# Patient Record
Sex: Female | Born: 1965 | Race: White | Hispanic: No | Marital: Married | Smoking: Never smoker
Health system: Southern US, Community
[De-identification: ages and names within clinical notes are randomized; demographics above are authoritative.]

## PROBLEM LIST (undated history)

## (undated) DIAGNOSIS — E079 Disorder of thyroid, unspecified: Secondary | ICD-10-CM

## (undated) HISTORY — PX: NASAL SINUS SURGERY: SHX719

---

## 2019-02-14 DIAGNOSIS — E039 Hypothyroidism, unspecified: Secondary | ICD-10-CM | POA: Insufficient documentation

## 2021-05-08 ENCOUNTER — Other Ambulatory Visit: Payer: Self-pay | Admitting: Obstetrics & Gynecology

## 2021-05-08 DIAGNOSIS — R928 Other abnormal and inconclusive findings on diagnostic imaging of breast: Secondary | ICD-10-CM

## 2021-05-23 ENCOUNTER — Ambulatory Visit
Admission: RE | Admit: 2021-05-23 | Discharge: 2021-05-23 | Disposition: A | Discharge status: Home / Self Care | Payer: BC Managed Care – PPO | Source: Ambulatory Visit | Attending: Obstetrics & Gynecology | Admitting: Obstetrics & Gynecology

## 2021-05-23 ENCOUNTER — Ambulatory Visit
Admission: RE | Admit: 2021-05-23 | Discharge: 2021-05-23 | Disposition: A | Discharge status: Home / Self Care | Payer: Self-pay | Source: Ambulatory Visit | Attending: Obstetrics & Gynecology | Admitting: Obstetrics & Gynecology

## 2021-05-23 DIAGNOSIS — R928 Other abnormal and inconclusive findings on diagnostic imaging of breast: Secondary | ICD-10-CM

## 2021-09-04 ENCOUNTER — Emergency Department (HOSPITAL_BASED_OUTPATIENT_CLINIC_OR_DEPARTMENT_OTHER): Payer: BC Managed Care – PPO

## 2021-09-04 ENCOUNTER — Other Ambulatory Visit: Payer: Self-pay

## 2021-09-04 ENCOUNTER — Encounter (HOSPITAL_BASED_OUTPATIENT_CLINIC_OR_DEPARTMENT_OTHER): Payer: Self-pay | Admitting: *Deleted

## 2021-09-04 ENCOUNTER — Emergency Department (HOSPITAL_BASED_OUTPATIENT_CLINIC_OR_DEPARTMENT_OTHER): Payer: BC Managed Care – PPO | Admitting: Radiology

## 2021-09-04 ENCOUNTER — Emergency Department (HOSPITAL_BASED_OUTPATIENT_CLINIC_OR_DEPARTMENT_OTHER)
Admission: EM | Admit: 2021-09-04 | Discharge: 2021-09-04 | Disposition: A | Discharge status: Home / Self Care | Payer: BC Managed Care – PPO | Source: Home / Self Care | Attending: Emergency Medicine | Admitting: Emergency Medicine

## 2021-09-04 DIAGNOSIS — D696 Thrombocytopenia, unspecified: Secondary | ICD-10-CM | POA: Insufficient documentation

## 2021-09-04 DIAGNOSIS — R059 Cough, unspecified: Secondary | ICD-10-CM | POA: Insufficient documentation

## 2021-09-04 DIAGNOSIS — E871 Hypo-osmolality and hyponatremia: Secondary | ICD-10-CM | POA: Insufficient documentation

## 2021-09-04 DIAGNOSIS — R509 Fever, unspecified: Secondary | ICD-10-CM

## 2021-09-04 DIAGNOSIS — J3489 Other specified disorders of nose and nasal sinuses: Secondary | ICD-10-CM | POA: Insufficient documentation

## 2021-09-04 DIAGNOSIS — R55 Syncope and collapse: Secondary | ICD-10-CM | POA: Diagnosis not present

## 2021-09-04 DIAGNOSIS — N390 Urinary tract infection, site not specified: Secondary | ICD-10-CM | POA: Insufficient documentation

## 2021-09-04 DIAGNOSIS — Z20822 Contact with and (suspected) exposure to covid-19: Secondary | ICD-10-CM | POA: Insufficient documentation

## 2021-09-04 DIAGNOSIS — R300 Dysuria: Secondary | ICD-10-CM

## 2021-09-04 DIAGNOSIS — I602 Nontraumatic subarachnoid hemorrhage from anterior communicating artery: Secondary | ICD-10-CM | POA: Diagnosis not present

## 2021-09-04 HISTORY — DX: Disorder of thyroid, unspecified: E07.9

## 2021-09-04 LAB — URINALYSIS, ROUTINE W REFLEX MICROSCOPIC
Bilirubin Urine: NEGATIVE
Glucose, UA: NEGATIVE mg/dL
Hgb urine dipstick: NEGATIVE
Ketones, ur: NEGATIVE mg/dL
Leukocytes,Ua: NEGATIVE
Nitrite: NEGATIVE
Protein, ur: 30 mg/dL — AB
Specific Gravity, Urine: 1.028 (ref 1.005–1.030)
pH: 5.5 (ref 5.0–8.0)

## 2021-09-04 LAB — CBC WITH DIFFERENTIAL/PLATELET
Abs Immature Granulocytes: 0 10*3/uL (ref 0.00–0.07)
Basophils Absolute: 0 10*3/uL (ref 0.0–0.1)
Basophils Relative: 0 %
Eosinophils Absolute: 0 10*3/uL (ref 0.0–0.5)
Eosinophils Relative: 1 %
HCT: 41.7 % (ref 36.0–46.0)
Hemoglobin: 13.4 g/dL (ref 12.0–15.0)
Lymphocytes Relative: 13 %
Lymphs Abs: 0.4 10*3/uL — ABNORMAL LOW (ref 0.7–4.0)
MCH: 28.6 pg (ref 26.0–34.0)
MCHC: 32.1 g/dL (ref 30.0–36.0)
MCV: 89.1 fL (ref 80.0–100.0)
Monocytes Absolute: 0.3 10*3/uL (ref 0.1–1.0)
Monocytes Relative: 12 %
Neutro Abs: 2.1 10*3/uL (ref 1.7–7.7)
Neutrophils Relative %: 74 %
Platelets: 132 10*3/uL — ABNORMAL LOW (ref 150–400)
RBC: 4.68 MIL/uL (ref 3.87–5.11)
RDW: 13.4 % (ref 11.5–15.5)
Smear Review: DECREASED
WBC: 2.8 10*3/uL — ABNORMAL LOW (ref 4.0–10.5)
nRBC: 0 % (ref 0.0–0.2)

## 2021-09-04 LAB — BASIC METABOLIC PANEL
Anion gap: 6 (ref 5–15)
BUN: 16 mg/dL (ref 6–20)
CO2: 30 mmol/L (ref 22–32)
Calcium: 8.9 mg/dL (ref 8.9–10.3)
Chloride: 97 mmol/L — ABNORMAL LOW (ref 98–111)
Creatinine, Ser: 0.86 mg/dL (ref 0.44–1.00)
GFR, Estimated: >60 mL/min (ref >60)
Glucose, Bld: 100 mg/dL — ABNORMAL HIGH (ref 70–99)
Potassium: 3.7 mmol/L (ref 3.5–5.1)
Sodium: 133 mmol/L — ABNORMAL LOW (ref 135–145)

## 2021-09-04 LAB — RESP PANEL BY RT-PCR (FLU A&B, COVID) ARPGX2
Influenza A by PCR: NEGATIVE
Influenza B by PCR: NEGATIVE
SARS Coronavirus 2 by RT PCR: NEGATIVE

## 2021-09-04 LAB — LACTIC ACID, PLASMA: Lactic Acid, Venous: 0.8 mmol/L (ref 0.5–1.9)

## 2021-09-04 MED ORDER — CEFTRIAXONE SODIUM 1 G IJ SOLR
1.0000 g | Freq: Once | INTRAMUSCULAR | Status: AC
  Administered 2021-09-04: 1 g via INTRAMUSCULAR
  Filled 2021-09-04: qty 10

## 2021-09-04 MED ORDER — CEPHALEXIN 500 MG PO CAPS
500.0000 mg | ORAL_CAPSULE | Freq: Four times a day (QID) | ORAL | 0 refills | Status: DC
Start: 2021-09-04 — End: 2021-09-04

## 2021-09-04 MED ORDER — IBUPROFEN 800 MG PO TABS
800.0000 mg | ORAL_TABLET | Freq: Once | ORAL | Status: AC
Start: 2021-09-04 — End: 2021-09-04
  Administered 2021-09-04: 800 mg via ORAL
  Filled 2021-09-04: qty 1

## 2021-09-04 MED ORDER — CEPHALEXIN 500 MG PO CAPS: 500.0000 mg | ORAL_CAPSULE | Freq: Four times a day (QID) | ORAL | 0 refills | Status: DC

## 2021-09-04 NOTE — ED Triage Notes (Signed)
Pt was recently treated for a UTI, culture showed that it was an e-coli positive UTI. She completed her antibiotic on Monday.  Pt reports continued feeling unwell with pelvic pressure and feels not herself and has a cough.

## 2021-09-04 NOTE — ED Notes (Signed)
Patient tolerating admin of abx, will monitor for 15 mins for any reactions prior to d/c.

## 2021-09-04 NOTE — ED Provider Notes (Signed)
[facility] EMERGENCY DEPT Provider Note   CSN: [id number] Arrival date & time: 09/04/21  1450     History {Add pertinent medical, surgical, social history, OB history to HPI:1} Chief Complaint  Patient presents with   Urinary Tract Infection    Katherine Winters is a 56 y.o. female.  She has no significant past medical history.  Last week she had some dysuria and contacted her PCP who checked her urine, told her she tested positive for E. coli and put her on antibiotics.  That seemed to subside and she went to a horse show that was outside for multiple days.  Since then she has felt a little more fatigued and and had a nonproductive cough.  Still has a little bit of dysuria.  Has noticed a little bit of pain in her left flank.  She presented here today with a temperature of 102.  No worsening of cough no nausea vomiting diarrhea.  No gross hematuria.  The history is provided by the patient.  Fever Max temp prior to arrival:  102 Temp source:  Oral Progression:  Unchanged Chronicity:  New Relieved by:  None tried Worsened by:  Nothing Ineffective treatments:  None tried Associated symptoms: cough, dysuria and rhinorrhea   Associated symptoms: no chest pain, no chills, no diarrhea, no myalgias, no nausea, no rash, no sore throat and no vomiting   Risk factors: recent travel   Risk factors: no immunosuppression and no sick contacts       Home Medications Prior to Admission medications   Not on File      Allergies    Patient has no known allergies.    Review of Systems   Review of Systems  Constitutional:  Positive for fever. Negative for chills.  HENT:  Positive for rhinorrhea. Negative for sore throat.   Eyes:  Negative for visual disturbance.  Respiratory:  Positive for cough.   Cardiovascular:  Negative for chest pain.  Gastrointestinal:  Negative for diarrhea, nausea and vomiting.  Genitourinary:  Positive for dysuria.  Musculoskeletal:  Negative for  myalgias.  Skin:  Negative for rash.   Physical Exam Updated Vital Signs BP 136/84 (BP Location: Right Arm)   Pulse 80   Temp 98.4 F (36.9 C) (Oral)   Resp 15   Wt 89.4 kg   SpO2 99%  Physical Exam Vitals and nursing note reviewed.  Constitutional:      General: She is not in acute distress.    Appearance: Normal appearance. She is well-developed.  HENT:     Head: Normocephalic and atraumatic.     Mouth/Throat:     Mouth: Mucous membranes are moist.     Pharynx: Oropharynx is clear.  Eyes:     Conjunctiva/sclera: Conjunctivae normal.  Cardiovascular:     Rate and Rhythm: Normal rate and regular rhythm.     Heart sounds: No murmur heard. Pulmonary:     Effort: Pulmonary effort is normal. No respiratory distress.     Breath sounds: Normal breath sounds.  Abdominal:     Palpations: Abdomen is soft.     Tenderness: There is no abdominal tenderness. There is no guarding or rebound.  Musculoskeletal:     Cervical back: Neck supple.     Right lower leg: No edema.     Left lower leg: No edema.  Skin:    General: Skin is warm and dry.     Capillary Refill: Capillary refill takes less than 2 seconds.  Neurological:  General: No focal deficit present.     Mental Status: She is alert.    ED Results / Procedures / Treatments   Labs (all labs ordered are listed, but only abnormal results are displayed) Labs Reviewed  URINALYSIS, ROUTINE W REFLEX MICROSCOPIC - Abnormal; Notable for the following components:      Result Value   APPearance HAZY (*)    Protein, ur 30 (*)    Crystals PRESENT (*)    All other components within normal limits  CBC WITH DIFFERENTIAL/PLATELET - Abnormal; Notable for the following components:   WBC 2.8 (*)    Platelets 132 (*)    Lymphs Abs 0.4 (*)    All other components within normal limits  BASIC METABOLIC PANEL - Abnormal; Notable for the following components:   Sodium 133 (*)    Chloride 97 (*)    Glucose, Bld 100 (*)    All other  components within normal limits    EKG None  Radiology DG Chest 2 View  Result Date: 09/04/2021 CLINICAL DATA:  Cough, fever. EXAM: CHEST - 2 VIEW COMPARISON:  None Available. FINDINGS: The heart size and mediastinal contours are within normal limits. Both lungs are clear. The visualized skeletal structures are unremarkable. IMPRESSION: No active cardiopulmonary disease. Electronically Signed   By: Lupita Raider M.D.   On: 09/04/2021 15:55    Procedures Procedures  {Document cardiac monitor, telemetry assessment procedure when appropriate:1}  Medications Ordered in ED Medications  ibuprofen (ADVIL) tablet 800 mg (800 mg Oral Given 09/04/21 1542)    ED Course/ Medical Decision Making/ A&P                           Medical Decision Making Amount and/or Complexity of Data Reviewed Labs: ordered. Radiology: ordered.  Risk Prescription drug management.  Katherine Winters was evaluated in Emergency Department on 09/04/2021 for the symptoms described in the history of present illness. She was evaluated in the context of the global COVID-19 pandemic, which necessitated consideration that the patient might be at risk for infection with the SARS-CoV-2 virus that causes COVID-19. Institutional protocols and algorithms that pertain to the evaluation of patients at risk for COVID-19 are in a state of rapid change based on information released by regulatory bodies including the CDC and federal and state organizations. These policies and algorithms were followed during the patient's care in the ED.  This patient complains of ***; this involves an extensive number of treatment Options and is a complaint that carries with it a high risk of complications and morbidity. The differential includes ***  I ordered, reviewed and interpreted labs, which included *** I ordered medication *** and reviewed PMP when indicated. I ordered imaging studies which included *** and I independently    visualized and  interpreted imaging which showed *** Additional history obtained from *** Previous records obtained and reviewed *** I consulted *** and discussed lab and imaging findings and discussed disposition.  Cardiac monitoring reviewed, *** Social determinants considered, *** Critical Interventions: ***  After the interventions stated above, I reevaluated the patient and found *** Admission and further testing considered, ***    {Document critical care time when appropriate:1} {Document review of labs and clinical decision tools ie heart score, Chads2Vasc2 etc:1}  {Document your independent review of radiology images, and any outside records:1} {Document your discussion with family members, caretakers, and with consultants:1} {Document social determinants of health affecting pt's care:1} {Document your decision  making why or why not admission, treatments were needed:1} Final Clinical Impression(s) / ED Diagnoses Final diagnoses:  None    Rx / DC Orders ED Discharge Orders     None

## 2021-09-04 NOTE — ED Notes (Signed)
Patient denies any s/s of an allergic reaction from the antibiotic administration.

## 2021-09-04 NOTE — Discharge Instructions (Signed)
You were seen in the emergency department for fever malaise in the setting of a recent urinary tract infection.  We did not find an obvious cause of your fever.  You were given a dose of antibiotics and a prescription to cover possible recurrent urinary tract infection.  Please drink plenty of fluids and monitor your symptoms.  Follow-up with your doctor.  Return to the emergency department if any worsening or concerning symptoms.

## 2021-09-06 LAB — URINE CULTURE

## 2021-09-07 ENCOUNTER — Emergency Department (HOSPITAL_BASED_OUTPATIENT_CLINIC_OR_DEPARTMENT_OTHER): Payer: BC Managed Care – PPO

## 2021-09-07 ENCOUNTER — Emergency Department (HOSPITAL_BASED_OUTPATIENT_CLINIC_OR_DEPARTMENT_OTHER): Payer: BC Managed Care – PPO | Admitting: Radiology

## 2021-09-07 ENCOUNTER — Other Ambulatory Visit: Payer: Self-pay

## 2021-09-07 ENCOUNTER — Encounter (HOSPITAL_BASED_OUTPATIENT_CLINIC_OR_DEPARTMENT_OTHER): Payer: Self-pay | Admitting: Emergency Medicine

## 2021-09-07 ENCOUNTER — Inpatient Hospital Stay (HOSPITAL_BASED_OUTPATIENT_CLINIC_OR_DEPARTMENT_OTHER)
Admission: EM | Admit: 2021-09-07 | Discharge: 2021-09-09 | DRG: 064 | Disposition: A | Discharge status: Home / Self Care | Payer: BC Managed Care – PPO | Attending: Family Medicine | Admitting: Family Medicine

## 2021-09-07 DIAGNOSIS — E860 Dehydration: Secondary | ICD-10-CM | POA: Diagnosis present

## 2021-09-07 DIAGNOSIS — R55 Syncope and collapse: Secondary | ICD-10-CM | POA: Diagnosis present

## 2021-09-07 DIAGNOSIS — B349 Viral infection, unspecified: Secondary | ICD-10-CM | POA: Diagnosis not present

## 2021-09-07 DIAGNOSIS — I609 Nontraumatic subarachnoid hemorrhage, unspecified: Secondary | ICD-10-CM

## 2021-09-07 DIAGNOSIS — Z79899 Other long term (current) drug therapy: Secondary | ICD-10-CM

## 2021-09-07 DIAGNOSIS — I959 Hypotension, unspecified: Secondary | ICD-10-CM | POA: Diagnosis present

## 2021-09-07 DIAGNOSIS — Z20822 Contact with and (suspected) exposure to covid-19: Secondary | ICD-10-CM | POA: Diagnosis not present

## 2021-09-07 DIAGNOSIS — I602 Nontraumatic subarachnoid hemorrhage from anterior communicating artery: Principal | ICD-10-CM | POA: Diagnosis present

## 2021-09-07 DIAGNOSIS — J129 Viral pneumonia, unspecified: Secondary | ICD-10-CM | POA: Diagnosis present

## 2021-09-07 DIAGNOSIS — Z79890 Hormone replacement therapy (postmenopausal): Secondary | ICD-10-CM | POA: Diagnosis not present

## 2021-09-07 DIAGNOSIS — E039 Hypothyroidism, unspecified: Secondary | ICD-10-CM | POA: Diagnosis present

## 2021-09-07 LAB — CBC
HCT: 38.3 % (ref 36.0–46.0)
Hemoglobin: 12.6 g/dL (ref 12.0–15.0)
MCH: 28.9 pg (ref 26.0–34.0)
MCHC: 32.9 g/dL (ref 30.0–36.0)
MCV: 87.8 fL (ref 80.0–100.0)
Platelets: 112 10*3/uL — ABNORMAL LOW (ref 150–400)
RBC: 4.36 MIL/uL (ref 3.87–5.11)
RDW: 13.9 % (ref 11.5–15.5)
WBC: 4.4 10*3/uL (ref 4.0–10.5)
nRBC: 0 % (ref 0.0–0.2)

## 2021-09-07 LAB — SARS CORONAVIRUS 2 BY RT PCR: SARS Coronavirus 2 by RT PCR: NEGATIVE

## 2021-09-07 LAB — COMPREHENSIVE METABOLIC PANEL
ALT: 43 U/L (ref 0–44)
AST: 58 U/L — ABNORMAL HIGH (ref 15–41)
Albumin: 3.6 g/dL (ref 3.5–5.0)
Alkaline Phosphatase: 52 U/L (ref 38–126)
Anion gap: 10 (ref 5–15)
BUN: 14 mg/dL (ref 6–20)
CO2: 29 mmol/L (ref 22–32)
Calcium: 8.7 mg/dL — ABNORMAL LOW (ref 8.9–10.3)
Chloride: 93 mmol/L — ABNORMAL LOW (ref 98–111)
Creatinine, Ser: 0.87 mg/dL (ref 0.44–1.00)
GFR, Estimated: >60 mL/min (ref >60)
Glucose, Bld: 131 mg/dL — ABNORMAL HIGH (ref 70–99)
Potassium: 3.3 mmol/L — ABNORMAL LOW (ref 3.5–5.1)
Sodium: 132 mmol/L — ABNORMAL LOW (ref 135–145)
Total Bilirubin: 0.7 mg/dL (ref 0.3–1.2)
Total Protein: 6.5 g/dL (ref 6.5–8.1)

## 2021-09-07 LAB — D-DIMER, QUANTITATIVE: D-Dimer, Quant: 3.08 ug/mL-FEU — ABNORMAL HIGH (ref 0.00–0.50)

## 2021-09-07 LAB — PREGNANCY, URINE: Preg Test, Ur: NEGATIVE

## 2021-09-07 MED ORDER — ACETAMINOPHEN-CODEINE 300-30 MG PO TABS
1.0000 | ORAL_TABLET | Freq: Four times a day (QID) | ORAL | Status: DC | PRN
  Administered 2021-09-07 – 2021-09-08 (×2): 2 via ORAL
  Filled 2021-09-07 (×3): qty 2

## 2021-09-07 MED ORDER — ACETAMINOPHEN 325 MG PO TABS: 650.0000 mg | ORAL_TABLET | Freq: Four times a day (QID) | ORAL | Status: DC | PRN

## 2021-09-07 MED ORDER — IOHEXOL 350 MG/ML SOLN
75.0000 mL | Freq: Once | INTRAVENOUS | Status: AC | PRN
  Administered 2021-09-07: 75 mL via INTRAVENOUS

## 2021-09-07 MED ORDER — SODIUM CHLORIDE 0.9 % IV BOLUS
1000.0000 mL | Freq: Once | INTRAVENOUS | Status: AC
  Administered 2021-09-07: 1000 mL via INTRAVENOUS

## 2021-09-07 MED ORDER — LACTATED RINGERS IV SOLN: INTRAVENOUS | Status: DC

## 2021-09-07 MED ORDER — LEVOTHYROXINE SODIUM 75 MCG PO TABS
75.0000 ug | ORAL_TABLET | Freq: Every day | ORAL | Status: DC
  Administered 2021-09-08 – 2021-09-09 (×2): 75 ug via ORAL
  Filled 2021-09-07 (×2): qty 1

## 2021-09-07 MED ORDER — POTASSIUM CHLORIDE 20 MEQ PO PACK
40.0000 meq | PACK | Freq: Once | ORAL | Status: AC
  Administered 2021-09-07: 40 meq via ORAL
  Filled 2021-09-07: qty 2

## 2021-09-07 MED ORDER — ACETAMINOPHEN 325 MG PO TABS
650.0000 mg | ORAL_TABLET | Freq: Once | ORAL | Status: AC
  Administered 2021-09-07: 650 mg via ORAL
  Filled 2021-09-07: qty 2

## 2021-09-07 MED ORDER — ACETAMINOPHEN 650 MG RE SUPP: 650.0000 mg | Freq: Four times a day (QID) | RECTAL | Status: DC | PRN

## 2021-09-07 NOTE — Progress Notes (Signed)
Discussed case with Dr. Danielle Dess with neurosurgery.  He believes trauma would be an usual cause of her SAH with her pattern of bleeding. Syncope could possibly be due to [facility].  He advised neuro checks q2h at least overnight as well as mIVF with LR. No indication to get repeat CT scan at this time unless her neuro status changes.  He will come see the patient later tonight.

## 2021-09-07 NOTE — Progress Notes (Signed)
FPTS Brief Progress Note  S:Went bedside to see patient. Patient laying comfortably in bed, complaining of some cough.    O: BP (!) 99/47   Pulse 84   Temp 100.3 F (37.9 C) (Oral)   Resp (!) 22   Ht 5\' 6"  (1.676 m)   Wt 81.6 kg   SpO2 96%   BMI 29.05 kg/m   Neuro: Sensation grossly intact, motor 5/5 in all extremities, EOMI, visual fields normal, cranial nerves grossly intact Resp: CTABL Card: RRR, NRMG  A/P: Syncope  SAH Patient appears stable with no FND at this time. No complaints of dizziness. - Plans per day team - Orders reviewed. Labs for AM ordered, which was adjusted as needed.   , MD 09/07/2021, 8:35 PM PGY-1, [facility] Night Resident  Please page [phone] with questions.

## 2021-09-07 NOTE — H&P (Signed)
Family Medicine Teaching [facility] Admission History and Physical Service Pager: [phone]  Patient name: Katherine Winters Medical record number: [id number] Date of birth: March 04, 1966 Age: 56 y.o. Gender: female  Primary Care Provider: Ladora Daniel, PA-C Consultants:  Code Status: Full   Preferred Emergency Contact:  Contact Information     Name Relation Home Work Mobile   Merced,Ken Spouse   [phone]        Chief Complaint: Syncope  Assessment and Plan: Katherine Winters is a 56 y.o. female without significant medical present with syncope.    Syncope likely 2/2 to [facility] Patient admitted for syncope episode with loss of consciousness. Report intermittent fever through the week with generalized weakness and dry cough. She also endorses lack of appetite with poor PO intake.  Initial labs showed no leukocytosis, negative pregnancy and COVID test.  Patient's overall presentation is suspicious of possible viral illness with poor p.o. intake.  Given high report of intermittent hypotension we will orthostatic vitals.  Also her syncope could be attributed to subarachnoid hemorrhage which is seen on CT. Will consult neuro surgery for recommendations. Other causes of syncope to be considered include vasal vagal from constant coughing or structural heart defect. Will obtain echo. Although on exam patient had normal neuro exam, however given loss of consciousness and head CT finding low threshold to obtain EEG.   At this time we will continue conservative management with neurochecks, obtain blood cultures and follow vitals closely.   - Admit to Med Tele with FPTS, attending Dr. Jennette Kettle -Neurology consulted in the ER, appreciate recs - Continuous cardiac monitoring -PT/OT eval and treat -Neurochecks Q2H -Fall precaution -Follow- up Echo -Continue routine vitals -SCDs for VTE prophylaxis -Blood pressure systolic goes <160  Hypothyroidism Chronic and stable.  On home medication of Synthroid 1 seven  5 mcg daily. -Continue home Synthroid   FEN/GI: Regular diet Prophylaxis: SCDs  Disposition: Progressive  History of Present Illness:  Katherine Winters is a 56 y.o. female presenting with [place] and generalized weakness.  #Her symptoms started about a week ago with generalized weakness.  Prior to that patient says she was diagnosed with UTI and was started on antibiotics which she could not remember the name.  Symptoms started after she went on a horseshoe where she was rained on.  It was after this showed the patient started feeling generally weak and but is having some intermittent fevers noted responded well to Advil.  The last week patient states she has been having poor appetite which significant decrease in p.o. intake.  In addition to the generalized weakness patient endorsed having cough and mild headache but denies any chest pain, palpitations, dizziness or lightheadedness.  This morning she went to sit on the porch when she noticed her vision started fading out, at that time patient decided to go to the fridge to get some Coke because her sugar might be low. Next thing she remembered was her on the floor, she got scared and called her husband to take her to the ED  Patient denies any history of smoking, rare use of alcohol and no illicit drugs.  The ED head CT showed subarachnoid hemorrhage and neurosurgery was consulted who recommend admitting patient for syncope rule out.  Neurology recommend no surgical intervention necessary at this time.   Review Of Systems: Per HPI with the following additions:   Review of Systems  Constitutional:  Positive for appetite change, fatigue and fever. Negative for chills.  HENT:  Positive for congestion and postnasal  drip. Negative for rhinorrhea and sore throat.   Respiratory:  Positive for cough. Negative for chest tightness and wheezing.   Cardiovascular:  Negative for chest pain, palpitations and leg swelling.  Gastrointestinal:  Negative for  abdominal pain, constipation, diarrhea, nausea and vomiting.  Genitourinary:  Negative for dysuria, hematuria, urgency and vaginal discharge.  Neurological:  Positive for headaches. Negative for dizziness, seizures, syncope and light-headedness.    There are no problems to display for this patient.   Past Medical History: Past Medical History:  Diagnosis Date   Thyroid disease     Past Surgical History: Past Surgical History:  Procedure Laterality Date   NASAL SINUS SURGERY      Social History: Social History   Tobacco Use   Smoking status: Never   Smokeless tobacco: Never  Vaping Use   Vaping Use: Never used  Substance Use Topics   Alcohol use: Never   Additional social history:   Please also refer to relevant sections of EMR.  Family History: Family History  Problem Relation Age of Onset   Breast cancer Maternal Grandmother    (If not completed, MUST add something in)  Allergies and Medications: No Known Allergies No current facility-administered medications on file prior to encounter.   Current Outpatient Medications on File Prior to Encounter  Medication Sig Dispense Refill   Ascorbic Acid (VITAMIN C PO) Take 1 tablet by mouth daily.     cephALEXin (KEFLEX) 500 MG capsule Take 1 capsule (500 mg total) by mouth 4 (four) times daily. 28 capsule 0   Dextromethorphan-guaiFENesin (DELSYM COUGH/CHEST CONGEST DM) 5-100 MG/5ML LIQD Take 15 mLs by mouth at bedtime as needed (cough, fever).     ibuprofen (ADVIL) 200 MG tablet Take 200 mg by mouth every 6 (six) hours as needed for fever.     levothyroxine (SYNTHROID) 75 MCG tablet Take 75 mcg by mouth daily.      Objective: BP 121/67 (BP Location: Right Arm)   Pulse 96   Temp (!) 102.5 F (39.2 C) (Oral)   Resp 20   Ht 5\' 6"  (1.676 m)   Wt 81.6 kg   SpO2 100%   BMI 29.05 kg/m  Exam: General: Alert, well appearing, NAD HEENT: Atraumatic, MMM, No sclera icterus CV: RRR, no murmurs, normal S1/S2 Pulm: CTAB,  good WOB on RA, no crackles or wheezing Abd: Soft, no distension, no tenderness Skin: dry, warm Ext: No BLE edema, +2 Pedal and radial pulse.   Labs and Imaging: CBC BMET  Recent Labs  Lab 09/07/21 0915  WBC 4.4  HGB 12.6  HCT 38.3  PLT 112*   Recent Labs  Lab 09/07/21 0915  NA 132*  K 3.3*  CL 93*  CO2 29  BUN 14  CREATININE 0.87  GLUCOSE 131*  CALCIUM 8.7*     EKG: Normal sinus rhythm with non specific ST changes  11/07/21, MD 09/07/2021, 5:11 PM PGY-1, [facility] FPTS Intern pager: [phone], text pages welcome

## 2021-09-07 NOTE — Hospital Course (Signed)
Katherine Winters is a 56 y.o. female without significant PMHx who presented after syncopal episode and found to have subarachnoid hemorrhage and likely pneumonia.   Subarachnoid hemorrhage Patient was admitted after syncopal episode where she hit her head.  Head CT showed acute subarachnoid hemorrhage at the anterior interhemispheric fissure and layering atop the anterior corpus callosum.  Neurosurgery was consulted and recommended no surgical intervention at this time.  They did not think based on location of hemorrhage that it was caused by the fall but could have been caused by an aneurysm.  CTA head and neck was done which did not show an aneurysm.  They recommended outpatient follow-up with neurosurgery in 2 to 3 weeks after discharge.  Syncope Patient was admitted for syncope work-up.  She had low blood pressures near the start of admission with positive orthostatic vitals. BP was as low as 70s/40s. This significantly improved after 1 L bolus of fluids and maintenance IV fluids.  Repeat orthostatic vitals were normal and patient was normotensive prior to discharge.  Echocardiogram was completed which showed an ejection fraction of 60-65%, normal left ventricular function with no regional wall abnormalities and normal diastolic parameters with no valvular pathology.  It did show trivial pericardial effusion.  Syncope was likely due to combination of low blood pressure in the setting of dehydration and pneumonia.  Patient was advised at discharge to push oral fluids to stay hydrated and was given return precautions  Pneumonia Patient reported week long symptoms of cough, congestion, fatigue, and fevers.  Labs showed no leukocytosis and CXR did not suggest PNA. CT chest showed small bilateral pleural effusions with bilateral upper and lower lobe reticulonodular opacities likely representing mild infection.  Patient was started on azithromycin 500 mg daily.  She was discharged with 1 more dose of azithromycin  to complete a 3-day course. Of note, CTA also showed Multiple nodular opacities in the visualized right upper lobe measuring up to 7 mm, radiologist recommended non-contrast chest CT at 3-6 months to ensure resolution.   Issues for follow up No horseback riding until follow-up with neurosurgeon Ensure follow-up with neurosurgeon Ensure adequate p.o. intake to stay hydrated If patient does not get well, send to pulmonary for evaluation of possible pneumonitis.

## 2021-09-07 NOTE — ED Provider Notes (Signed)
Patient seen on arrival after transfer to our facility due to concern for intracranial hemorrhage in the context of syncope.  Patient is awake, alert, neurologically unremarkable.  I discussed her transfer with Dr. Danielle Dess our neurosurgeon.  He has reviewed imaging as well, is available for consult, requests medical team for admission.   Gerhard Munch, MD 09/07/21 [phone]

## 2021-09-07 NOTE — ED Notes (Signed)
Attempted to call x2 to give report

## 2021-09-07 NOTE — ED Notes (Signed)
Pt here from [facility] with complaints of syncope and fall today. Pt also complains of cough, low grade fever, and weakness x1 week.

## 2021-09-07 NOTE — Consult Note (Signed)
Reason for Consult: Subarachnoid hemorrhage Referring Physician: Dr. Marilynne Drivers is an 56 y.o. female.  HPI: Patient is a 56 year old right-handed individual who has been feeling poorly over the past few weeks having had some urinary tract infection respiratory infection also she had a fall today as she felt dizzy and lightheaded and had struck her head CT scan was performed at [facility] and this demonstrated presence of subarachnoid blood in the interhemispheric fissure just above the corpus callosum.'s concern for aneurysmal bleed from perhaps an anterior communicating artery aneurysm and a CTA was performed.  CTA demonstrates that there is no evidence of an aneurysm.  Nonetheless the patient has been having some headache and is now admitted for observation.  Past Medical History:  Diagnosis Date   Thyroid disease     Past Surgical History:  Procedure Laterality Date   NASAL SINUS SURGERY      Family History  Problem Relation Age of Onset   Breast cancer Maternal Grandmother     Social History:  reports that she has never smoked. She has never used smokeless tobacco. She reports that she does not drink alcohol. No history on file for drug use.  Allergies: No Known Allergies  Medications: I have not reviewed the patient's previous medications  Results for orders placed or performed during the hospital encounter of 09/07/21 (from the past 48 hour(s))  Comprehensive metabolic panel     Status: Abnormal   Collection Time: 09/07/21  9:15 AM  Result Value Ref Range   Sodium 132 (L) 135 - 145 mmol/L   Potassium 3.3 (L) 3.5 - 5.1 mmol/L   Chloride 93 (L) 98 - 111 mmol/L   CO2 29 22 - 32 mmol/L   Glucose, Bld 131 (H) 70 - 99 mg/dL    Comment: Glucose reference range applies only to samples taken after fasting for at least 8 hours.   BUN 14 6 - 20 mg/dL   Creatinine, Ser 0.87 0.44 - 1.00 mg/dL   Calcium 8.7 (L) 8.9 - 10.3 mg/dL   Total Protein 6.5 6.5 - 8.1 g/dL    Albumin 3.6 3.5 - 5.0 g/dL   AST 58 (H) 15 - 41 U/L   ALT 43 0 - 44 U/L   Alkaline Phosphatase 52 38 - 126 U/L   Total Bilirubin 0.7 0.3 - 1.2 mg/dL   GFR, Estimated >60 >60 mL/min    Comment: (NOTE) Calculated using the CKD-EPI Creatinine Equation (2021)    Anion gap 10 5 - 15    Comment: Performed at [facility], [address] [place], [place] [address]  CBC     Status: Abnormal   Collection Time: 09/07/21  9:15 AM  Result Value Ref Range   WBC 4.4 4.0 - 10.5 K/uL   RBC 4.36 3.87 - 5.11 MIL/uL   Hemoglobin 12.6 12.0 - 15.0 g/dL   HCT 38.3 36.0 - 46.0 %   MCV 87.8 80.0 - 100.0 fL   MCH 28.9 26.0 - 34.0 pg   MCHC 32.9 30.0 - 36.0 g/dL   RDW 13.9 11.5 - 15.5 %   Platelets 112 (L) 150 - 400 K/uL    Comment: SPECIMEN CHECKED FOR CLOTS Immature Platelet Fraction may be clinically indicated, consider ordering this additional test [id number] CONSISTENT WITH PREVIOUS RESULT REPEATED TO VERIFY    nRBC 0.0 0.0 - 0.2 %    Comment: Performed at [facility], [address], [place], [place] [address]  D-dimer, quantitative  Status: Abnormal   Collection Time: 09/07/21  9:15 AM  Result Value Ref Range   D-Dimer, Quant 3.08 (H) 0.00 - 0.50 ug/mL-FEU    Comment: (NOTE) At the manufacturer cut-off value of 0.5 g/mL FEU, this assay has a negative predictive value of 95-100%.This assay is intended for use in conjunction with a clinical pretest probability (PTP) assessment model to exclude pulmonary embolism (PE) and deep venous thrombosis (DVT) in outpatients suspected of PE or DVT. Results should be correlated with clinical presentation. Performed at [facility], [address], [place], [place] [address]   SARS Coronavirus 2 by RT PCR (hospital order, performed in [facility] hospital lab) *cepheid single result test* Anterior Nasal Swab     Status: None   Collection Time: 09/07/21 11:40 AM   Specimen: Anterior Nasal  Swab  Result Value Ref Range   SARS Coronavirus 2 by RT PCR NEGATIVE NEGATIVE    Comment: (NOTE) SARS-CoV-2 target nucleic acids are NOT DETECTED.  The SARS-CoV-2 RNA is generally detectable in upper and lower respiratory specimens during the acute phase of infection. The lowest concentration of SARS-CoV-2 viral copies this assay can detect is 250 copies / mL. A negative result does not preclude SARS-CoV-2 infection and should not be used as the sole basis for treatment or other patient management decisions.  A negative result may occur with improper specimen collection / handling, submission of specimen other than nasopharyngeal swab, presence of viral mutation(s) within the areas targeted by this assay, and inadequate number of viral copies (<250 copies / mL). A negative result must be combined with clinical observations, patient history, and epidemiological information.  Fact Sheet for Patients:   https://www.patel.info/  Fact Sheet for Healthcare Providers: https://hall.com/  This test is not yet approved or  cleared by the [place] FDA and has been authorized for detection and/or diagnosis of SARS-CoV-2 by FDA under an Emergency Use Authorization (EUA).  This EUA will remain in effect (meaning this test can be used) for the duration of the COVID-19 declaration under Section 564(b)(1) of the Act, 21 U.S.C. section 360bbb-3(b)(1), unless the authorization is terminated or revoked sooner.  Performed at [facility], [address] [place], [place] [address]   Pregnancy, urine     Status: None   Collection Time: 09/07/21 12:00 PM  Result Value Ref Range   Preg Test, Ur NEGATIVE NEGATIVE    Comment:        THE SENSITIVITY OF THIS METHODOLOGY IS >20 mIU/mL. Performed at [facility], [address], [place], [place] [address]     CT ANGIO HEAD NECK W WO CM  Result Date:  09/07/2021 CLINICAL DATA:  Syncope/presyncope, cerebrovascular cause suspected SAH on CTH EXAM: CT ANGIOGRAPHY HEAD AND NECK TECHNIQUE: Multidetector CT imaging of the head and neck was performed using the standard protocol during bolus administration of intravenous contrast. Multiplanar CT image reconstructions and MIPs were obtained to evaluate the vascular anatomy. Carotid stenosis measurements (when applicable) are obtained utilizing NASCET criteria, using the distal internal carotid diameter as the denominator. RADIATION DOSE REDUCTION: This exam was performed according to the departmental dose-optimization program which includes automated exposure control, adjustment of the mA and/or kV according to patient size and/or use of iterative reconstruction technique. CONTRAST:  68mL OMNIPAQUE IOHEXOL 350 MG/ML SOLN COMPARISON:  CT head from the same day. FINDINGS: CTA NECK FINDINGS Aortic arch: Great vessel origins are patent without proximal hemodynamically significant stenosis. Right carotid system: No evidence of dissection, stenosis (  50% or greater) or occlusion. Left carotid system: No evidence of dissection, stenosis (50% or greater) or occlusion. Vertebral arteries: Codominant. No evidence of dissection, stenosis (50% or greater) or occlusion. Skeleton: Moderate degenerative disease at C5-C6. Other neck: Upper chest: Multiple nodular opacities in the visualized right upper lobe measuring up to 7 mm with mild surrounding tree-in-bud opacities Review of the MIP images confirms the above findings CTA HEAD FINDINGS Anterior circulation: Bilateral intracranial ICAs, MCAs, and ACAs are patent without proximal hemodynamically significant stenosis. No aneurysm identified. Posterior circulation: Bilateral intradural vertebral arteries, basilar artery and bilateral posterior cerebral arteries are patent without proximal hemodynamically significant stenosis. Right fetal type PCA, anatomic variant. No aneurysm identified.  Venous sinuses: As permitted by contrast timing, patent. Anatomic variants: Described above. Review of the MIP images confirms the above findings IMPRESSION: 1. No emergent large vessel occlusion, proximal hemodynamically significant stenosis, or evidence of aneurysm. 2. Multiple nodular opacities in the visualized right upper lobe measuring up to 7 mm with mild surrounding tree-in-bud opacities. Findings could be infectious/inflammatory in etiology; however, recommend non-contrast chest CT at 3-6 months to ensure resolution. If the nodules are stable at time of repeat CT, then future CT at 18-24 months (from today's scan) is considered optional for low-risk patients, but is recommended for high-risk patients. This recommendation follows the consensus statement: Guidelines for Management of Incidental Pulmonary Nodules Detected on CT Images: From the Fleischner Society 2017; Radiology 2017; 284:228-243. Electronically Signed   By: Feliberto Harts M.D.   On: 09/07/2021 13:21   DG Chest 2 View  Result Date: 09/07/2021 CLINICAL DATA:  Cough.  Loss of consciousness. EXAM: CHEST - 2 VIEW COMPARISON:  Chest two views 09/04/2021 FINDINGS: Cardiac silhouette and mediastinal contours are within normal limits with mild calcification again seen within the aortic arch. Minimal lateral lower left lung horizontal subsegmental atelectasis versus scarring likely within the lingula appears new from prior. On lateral view no focal airspace opacity is seen to indicate pneumonia. No pleural effusion or pneumothorax. Mild multilevel degenerative disc changes of the thoracic spine. IMPRESSION: Mild horizontal linear subsegmental atelectasis versus scarring within the lingula on frontal view. No focal airspace opacity to indicate pneumonia. Electronically Signed   By: Neita Garnet M.D.   On: 09/07/2021 12:05   CT Head Wo Contrast  Result Date: 09/07/2021 CLINICAL DATA:  Blunt trauma, passed out today, fell striking back of head,  unsteady standing, recently treated for UTI EXAM: CT HEAD WITHOUT CONTRAST TECHNIQUE: Contiguous axial images were obtained from the base of the skull through the vertex without intravenous contrast. RADIATION DOSE REDUCTION: This exam was performed according to the departmental dose-optimization program which includes automated exposure control, adjustment of the mA and/or kV according to patient size and/or use of iterative reconstruction technique. COMPARISON:  None Available. FINDINGS: Brain: Normal ventricular morphology. No midline shift or mass effect. Subarachnoid hemorrhage identified within the anterior interhemispheric fissure and layering atop the corpus callosum. No intraparenchymal hemorrhage, mass lesion, or evidence of acute infarction. No additional extra-axial fluid collections. Vascular: No hyperdense vessels Skull: Calvaria intact.  Posterior RIGHT parietal scalp hematoma. Sinuses/Orbits: Clear Other: N/A IMPRESSION: Acute subarachnoid hemorrhage at the anterior interhemispheric fissure and layering atop the anterior corpus callosum. No intraparenchymal abnormalities. Critical Value/emergent results were called by telephone at the time of interpretation on 09/07/2021 at 12:09 pm to provider KRISTIE HORTON DO, who verbally acknowledged these results. Electronically Signed   By: Ulyses Southward M.D.   On: 09/07/2021 12:10  Review of Systems  Constitutional:  Positive for activity change.  Respiratory:  Positive for cough and wheezing.   Neurological:  Positive for light-headedness and headaches.  All other systems reviewed and are negative. Blood pressure (!) 99/47, pulse 84, temperature 100.3 F (37.9 C), temperature source Oral, resp. rate (!) 22, height 5\' 6"  (1.676 m), weight 81.6 kg, SpO2 96 %. Physical Exam Constitutional:      Appearance: Normal appearance.  HENT:     Head: Normocephalic and atraumatic.     Right Ear: Tympanic membrane normal.     Left Ear: Tympanic membrane  normal.     Nose: Nose normal.     Mouth/Throat:     Mouth: Mucous membranes are moist.  Eyes:     Extraocular Movements: Extraocular movements intact.     Conjunctiva/sclera: Conjunctivae normal.     Pupils: Pupils are equal, round, and reactive to light.  Cardiovascular:     Rate and Rhythm: Normal rate and regular rhythm.  Pulmonary:     Effort: Pulmonary effort is normal.  Abdominal:     General: Abdomen is flat. Bowel sounds are normal.     Palpations: Abdomen is soft.  Musculoskeletal:        General: Normal range of motion.     Cervical back: Normal range of motion.  Skin:    General: Skin is warm and dry.     Capillary Refill: Capillary refill takes less than 2 seconds.  Neurological:     Mental Status: She is alert.     Comments: Alert and oriented.  No meningismus is noted.  Cranial nerve examination is completely within the limits of normal.  Motor function is intact in the upper and lower extremities with normal reflexes in the biceps triceps brachioradialis 2+ reflexes in the patella and Achilles downgoing Babinski's are noted.  Psychiatric:        Mood and Affect: Mood normal.        Thought Content: Thought content normal.        Judgment: Judgment normal.    Assessment/Plan: Subarachnoid hemorrhage near the midline in the interhemispheric fissure is suggestive of possible anterior communicating artery aneurysm however none is found on the CTA.  Patient's headache is modest and she does not have any meningismus at this time.  Plan observation with IV hydration and treatment of general medical condition is suggested at this time.  We will follow along.  Blanchie Dessert Karessa Onorato 09/07/2021, 8:22 PM

## 2021-09-07 NOTE — ED Provider Notes (Signed)
[facility] EMERGENCY DEPT Provider Note   CSN: [id number] Arrival date & time: 09/07/21  [id number]     History  Chief Complaint  Patient presents with   Loss of Consciousness    Katherine Winters is a 56 y.o. female.  HPI  56 year old female presents to the emergency department with generalized illness for the past week as well as a syncopal episode with head injury.  Patient states a little over a week ago she was diagnosed with a UTI, completed an outpatient regimen of antibiotics.  She was seen here about 3 days ago with ongoing generalized illness and fatigue.  Work-up at that time was reassuring and she was discharged home.  Today she woke up feeling extremely fatigued.  States that she went out to sit on the porch, started having tunnel vision which is what prompted her to walk inside the kitchen to grab a drink.  Was at this time that the patient had a syncopal episode, waking up on the ground on her back.  Positive LOC.  No presyncopal symptoms.  Currently complaining of a mild posterior headache but no neck pain or stiffness.  Headache did not precede syncope.  Denies any fever.  Current main complaint is fatigue, nasal congestion and dry cough.  Home Medications Prior to Admission medications   Medication Sig Start Date End Date Taking? Authorizing Provider  cephALEXin (KEFLEX) 500 MG capsule Take 1 capsule (500 mg total) by mouth 4 (four) times daily. 09/04/21   Hayden Rasmussen, MD      Allergies    Patient has no known allergies.    Review of Systems   Review of Systems  Constitutional:  Positive for chills and fatigue. Negative for fever.  HENT:  Positive for congestion.   Respiratory:  Positive for cough. Negative for chest tightness and shortness of breath.   Cardiovascular:  Negative for chest pain, palpitations and leg swelling.  Gastrointestinal:  Negative for abdominal pain, diarrhea and vomiting.  Skin:  Negative for rash.  Neurological:  Positive for  syncope and headaches.   Physical Exam Updated Vital Signs BP 119/67   Pulse 83   Temp 98.3 F (36.8 C) (Oral)   Resp (!) 21   Ht 5\' 6"  (1.676 m)   Wt 81.6 kg   SpO2 96%   BMI 29.05 kg/m  Physical Exam Vitals and nursing note reviewed.  Constitutional:      General: She is not in acute distress.    Appearance: Normal appearance.  HENT:     Head: Normocephalic.     Mouth/Throat:     Mouth: Mucous membranes are moist.  Eyes:     Extraocular Movements: Extraocular movements intact.     Pupils: Pupils are equal, round, and reactive to light.  Cardiovascular:     Rate and Rhythm: Normal rate.  Pulmonary:     Effort: Pulmonary effort is normal. No respiratory distress.     Breath sounds: No rales.  Abdominal:     Palpations: Abdomen is soft.     Tenderness: There is no abdominal tenderness.  Musculoskeletal:        General: No swelling or deformity.     Cervical back: No rigidity.  Skin:    General: Skin is warm.  Neurological:     General: No focal deficit present.     Mental Status: She is alert and oriented to person, place, and time. Mental status is at baseline.  Psychiatric:  Mood and Affect: Mood normal.    ED Results / Procedures / Treatments   Labs (all labs ordered are listed, but only abnormal results are displayed) Labs Reviewed  COMPREHENSIVE METABOLIC PANEL - Abnormal; Notable for the following components:      Result Value   Sodium 132 (*)    Potassium 3.3 (*)    Chloride 93 (*)    Glucose, Bld 131 (*)    Calcium 8.7 (*)    AST 58 (*)    All other components within normal limits  CBC - Abnormal; Notable for the following components:   Platelets 112 (*)    All other components within normal limits  D-DIMER, QUANTITATIVE - Abnormal; Notable for the following components:   D-Dimer, Quant 3.08 (*)    All other components within normal limits  SARS CORONAVIRUS 2 BY RT PCR  PREGNANCY, URINE    EKG None  Radiology CT ANGIO HEAD NECK W  WO CM  Result Date: 09/07/2021 CLINICAL DATA:  Syncope/presyncope, cerebrovascular cause suspected SAH on CTH EXAM: CT ANGIOGRAPHY HEAD AND NECK TECHNIQUE: Multidetector CT imaging of the head and neck was performed using the standard protocol during bolus administration of intravenous contrast. Multiplanar CT image reconstructions and MIPs were obtained to evaluate the vascular anatomy. Carotid stenosis measurements (when applicable) are obtained utilizing NASCET criteria, using the distal internal carotid diameter as the denominator. RADIATION DOSE REDUCTION: This exam was performed according to the departmental dose-optimization program which includes automated exposure control, adjustment of the mA and/or kV according to patient size and/or use of iterative reconstruction technique. CONTRAST:  56mL OMNIPAQUE IOHEXOL 350 MG/ML SOLN COMPARISON:  CT head from the same day. FINDINGS: CTA NECK FINDINGS Aortic arch: Great vessel origins are patent without proximal hemodynamically significant stenosis. Right carotid system: No evidence of dissection, stenosis (50% or greater) or occlusion. Left carotid system: No evidence of dissection, stenosis (50% or greater) or occlusion. Vertebral arteries: Codominant. No evidence of dissection, stenosis (50% or greater) or occlusion. Skeleton: Moderate degenerative disease at C5-C6. Other neck: Upper chest: Multiple nodular opacities in the visualized right upper lobe measuring up to 7 mm with mild surrounding tree-in-bud opacities Review of the MIP images confirms the above findings CTA HEAD FINDINGS Anterior circulation: Bilateral intracranial ICAs, MCAs, and ACAs are patent without proximal hemodynamically significant stenosis. No aneurysm identified. Posterior circulation: Bilateral intradural vertebral arteries, basilar artery and bilateral posterior cerebral arteries are patent without proximal hemodynamically significant stenosis. Right fetal type PCA, anatomic variant.  No aneurysm identified. Venous sinuses: As permitted by contrast timing, patent. Anatomic variants: Described above. Review of the MIP images confirms the above findings IMPRESSION: 1. No emergent large vessel occlusion, proximal hemodynamically significant stenosis, or evidence of aneurysm. 2. Multiple nodular opacities in the visualized right upper lobe measuring up to 7 mm with mild surrounding tree-in-bud opacities. Findings could be infectious/inflammatory in etiology; however, recommend non-contrast chest CT at 3-6 months to ensure resolution. If the nodules are stable at time of repeat CT, then future CT at 18-24 months (from today's scan) is considered optional for low-risk patients, but is recommended for high-risk patients. This recommendation follows the consensus statement: Guidelines for Management of Incidental Pulmonary Nodules Detected on CT Images: From the Fleischner Society 2017; Radiology 2017; 284:228-243. Electronically Signed   By: Margaretha Sheffield M.D.   On: 09/07/2021 13:21   DG Chest 2 View  Result Date: 09/07/2021 CLINICAL DATA:  Cough.  Loss of consciousness. EXAM: CHEST - 2 VIEW COMPARISON:  Chest two views 09/04/2021 FINDINGS: Cardiac silhouette and mediastinal contours are within normal limits with mild calcification again seen within the aortic arch. Minimal lateral lower left lung horizontal subsegmental atelectasis versus scarring likely within the lingula appears new from prior. On lateral view no focal airspace opacity is seen to indicate pneumonia. No pleural effusion or pneumothorax. Mild multilevel degenerative disc changes of the thoracic spine. IMPRESSION: Mild horizontal linear subsegmental atelectasis versus scarring within the lingula on frontal view. No focal airspace opacity to indicate pneumonia. Electronically Signed   By: Yvonne Kendall M.D.   On: 09/07/2021 12:05   CT Head Wo Contrast  Result Date: 09/07/2021 CLINICAL DATA:  Blunt trauma, passed out today, fell  striking back of head, unsteady standing, recently treated for UTI EXAM: CT HEAD WITHOUT CONTRAST TECHNIQUE: Contiguous axial images were obtained from the base of the skull through the vertex without intravenous contrast. RADIATION DOSE REDUCTION: This exam was performed according to the departmental dose-optimization program which includes automated exposure control, adjustment of the mA and/or kV according to patient size and/or use of iterative reconstruction technique. COMPARISON:  None Available. FINDINGS: Brain: Normal ventricular morphology. No midline shift or mass effect. Subarachnoid hemorrhage identified within the anterior interhemispheric fissure and layering atop the corpus callosum. No intraparenchymal hemorrhage, mass lesion, or evidence of acute infarction. No additional extra-axial fluid collections. Vascular: No hyperdense vessels Skull: Calvaria intact.  Posterior RIGHT parietal scalp hematoma. Sinuses/Orbits: Clear Other: N/A IMPRESSION: Acute subarachnoid hemorrhage at the anterior interhemispheric fissure and layering atop the anterior corpus callosum. No intraparenchymal abnormalities. Critical Value/emergent results were called by telephone at the time of interpretation on 09/07/2021 at 12:09 pm to provider [place], who verbally acknowledged these results. Electronically Signed   By: Lavonia Dana M.D.   On: 09/07/2021 12:10    Procedures .Critical Care Performed by: Lorelle Gibbs, DO Authorized by: Lorelle Gibbs, DO   Critical care provider statement:    Critical care time (minutes):  45   Critical care time was exclusive of:  Separately billable procedures and treating other patients   Critical care was necessary to treat or prevent imminent or life-threatening deterioration of the following conditions:  CNS failure or compromise   Critical care was time spent personally by me on the following activities:  Development of treatment plan with patient or surrogate,  discussions with consultants, evaluation of patient's response to treatment, examination of patient, ordering and review of laboratory studies, ordering and review of radiographic studies, ordering and performing treatments and interventions, pulse oximetry, re-evaluation of patient's condition and review of old charts    Medications Ordered in ED Medications  sodium chloride 0.9 % bolus 1,000 mL (1,000 mLs Intravenous New Bag/Given 09/07/21 1200)  iohexol (OMNIPAQUE) 350 MG/ML injection 75 mL (75 mLs Intravenous Contrast Given 09/07/21 1256)    ED Course/ Medical Decision Making/ A&P                           Medical Decision Making Amount and/or Complexity of Data Reviewed Labs: ordered. Radiology: ordered.  Risk Prescription drug management.   56 year old female presents emergency department with ongoing generalized illness and an episode of syncope today.  Was recently treated for UTI, never regained her strength, now complaining of nasal congestion and dry cough.  Had a syncopal episode today with loss of consciousness.  No chest pain/palpitations or shortness of breath.  Now complaining of a mild posterior headache without  neck pain.  She is neuro intact.  Blood work is reassuring, cultures from her visit a couple days ago reviewed without any significant findings.  Dimer initially done in the setting of cough and syncope, elevated.  However head CT is concerning with findings of acute subarachnoid hemorrhage.  She is not on any anticoagulation, continues to be neurologically intact.  Spoke with on-call neurosurgeon, Dr. Ellene Route.  He request CT imaging of the head and neck in ER to ER transfer to [facility] for neurosurgery evaluation.  Patient and family updated on plan.  CT imaging of the head/neck took per evidence over CT PE study, lower suspicion for PE at this time.  CTA imaging will be completed and patient will be transferred to the ER in [facility].  Dr. Earl Lites is excepting.   Patient stable at time of transfer.        Final Clinical Impression(s) / ED Diagnoses Final diagnoses:  Subarachnoid bleed [facility])    Rx / DC Orders ED Discharge Orders     None         Lorelle Gibbs, DO 09/07/21 1355

## 2021-09-07 NOTE — ED Triage Notes (Signed)
Pt states she was seen here recently her for UTI and feeling unwell. States this morning she went to sit outback, passed out and fell. Pt hit back of her head and endorses soreness there. Pt ambulatory to room with assistance, pt states she felt unsteady.

## 2021-09-08 ENCOUNTER — Inpatient Hospital Stay (HOSPITAL_COMMUNITY): Payer: BC Managed Care – PPO

## 2021-09-08 ENCOUNTER — Encounter (HOSPITAL_COMMUNITY): Payer: Self-pay | Admitting: Family Medicine

## 2021-09-08 DIAGNOSIS — R55 Syncope and collapse: Secondary | ICD-10-CM

## 2021-09-08 DIAGNOSIS — B349 Viral infection, unspecified: Secondary | ICD-10-CM | POA: Diagnosis present

## 2021-09-08 LAB — ECHOCARDIOGRAM COMPLETE
AR max vel: 3.2 cm2
AV Area VTI: 3.22 cm2
AV Area mean vel: 3.01 cm2
AV Mean grad: 3.5 mmHg
AV Peak grad: 6.4 mmHg
Ao pk vel: 1.27 m/s
Area-P 1/2: 3.99 cm2
Calc EF: 59.4 %
Height: 66 in
S' Lateral: 3.6 cm
Single Plane A2C EF: 53.3 %
Single Plane A4C EF: 63.7 %
Weight: 2880 oz

## 2021-09-08 LAB — CBC
HCT: 36.1 % (ref 36.0–46.0)
Hemoglobin: 11.7 g/dL — ABNORMAL LOW (ref 12.0–15.0)
MCH: 28.9 pg (ref 26.0–34.0)
MCHC: 32.4 g/dL (ref 30.0–36.0)
MCV: 89.1 fL (ref 80.0–100.0)
Platelets: 129 10*3/uL — ABNORMAL LOW (ref 150–400)
RBC: 4.05 MIL/uL (ref 3.87–5.11)
RDW: 13.8 % (ref 11.5–15.5)
WBC: 4.1 10*3/uL (ref 4.0–10.5)
nRBC: 0 % (ref 0.0–0.2)

## 2021-09-08 LAB — BASIC METABOLIC PANEL
Anion gap: 7 (ref 5–15)
BUN: 11 mg/dL (ref 6–20)
CO2: 24 mmol/L (ref 22–32)
Calcium: 8 mg/dL — ABNORMAL LOW (ref 8.9–10.3)
Chloride: 102 mmol/L (ref 98–111)
Creatinine, Ser: 0.65 mg/dL (ref 0.44–1.00)
GFR, Estimated: >60 mL/min (ref >60)
Glucose, Bld: 105 mg/dL — ABNORMAL HIGH (ref 70–99)
Potassium: 3.7 mmol/L (ref 3.5–5.1)
Sodium: 133 mmol/L — ABNORMAL LOW (ref 135–145)

## 2021-09-08 LAB — HIV ANTIBODY (ROUTINE TESTING W REFLEX): HIV Screen 4th Generation wRfx: NONREACTIVE

## 2021-09-08 MED ORDER — AZITHROMYCIN 500 MG PO TABS
500.0000 mg | ORAL_TABLET | Freq: Every day | ORAL | Status: DC
  Administered 2021-09-08 – 2021-09-09 (×2): 500 mg via ORAL
  Filled 2021-09-08 (×2): qty 1

## 2021-09-08 NOTE — TOC CAGE-AID Note (Signed)
Transition of Care [facility]) - CAGE-AID Screening   Patient Details  Name: Katherine Winters MRN: [id number] Date of Birth: [birth year]  Transition of Care [facility]) CM/SW Contact:    Tymeer Vaquera C Tarpley-Carter, LCSWA Phone Number: 09/08/2021, 10:20 AM   Clinical Narrative: Pt participated in Cage-Aid.  Pt stated she does not use substance or ETOH.  Pt was not offered resources, due to no usage of substance or ETOH.     Cannon Arreola Tarpley-Carter, MSW, LCSW-A Pronouns:  She/Her/Hers [facility]Transitions of Care Clinical Social Worker Direct Number:  [phone] [email or web address]  CAGE-AID Screening:    Have You Ever Felt You Ought to Cut Down on Your Drinking or Drug Use?: No Have People Annoyed You By [employer] Your Drinking Or Drug Use?: No Have You Felt Bad Or Guilty About Your Drinking Or Drug Use?: No Have You Ever Had a Drink or Used Drugs First Thing In The Morning to Steady Your Nerves or to Get Rid of a Hangover?: No CAGE-AID Score: 0  Substance Abuse Education Offered: No

## 2021-09-08 NOTE — Evaluation (Signed)
Occupational Therapy Evaluation Patient Details Name: Katherine Winters MRN: [id number] DOB: 1965/07/05 Today's Date: 09/08/2021   History of Present Illness pt  is 56 yo F admitted 6/5 for Syncope with loss of consciousness.  Episodes of Hypotension.  6/6 chest CT reveals bilateral pleural effusions, suspectful of mild infection or aspiration.  6/6 Head CT  reveals Acute subarachnoid hemorrhage at the anterior interhemispheric fissure and layering atop the anterior corpus callosum.  PMH Thyroid disease, nasal sinus surgery   Clinical Impression   PTA patient reports living at home with her husband.  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  Pt currently presents requiring overall Min Guard for UB and LB ADLs.  Pt presents with decreased balance and cognition.  Decreased awareness and attention were demonstrated during trail making task.  Patient will benefit from additional OT services while on acute to further assess cognition.  Recommend dc to outpatient OT for cognitiion and to optimize return to PLOF and occupational performance requirements.     Recommendations for follow up therapy are one component of a multi-disciplinary discharge planning process, led by the attending physician.  Recommendations may be updated based on patient status, additional functional criteria and insurance authorization.   Follow Up Recommendations  Outpatient OT (Neuro)    Assistance Recommended at Discharge Set up Supervision/Assistance  Patient can return home with the following      Functional Status Assessment  Patient has had a recent decline in their functional status and demonstrates the ability to make significant improvements in function in a reasonable and predictable amount of time.  Equipment Recommendations  Tub/shower seat    Recommendations for Other Services PT consult;Speech consult     Precautions / Restrictions Precautions Precautions:  Fall Precaution Comments: monitor vitals Restrictions Weight Bearing Restrictions: No      Mobility Bed Mobility               General bed mobility comments: Sitting at EOB after talking with MD    Transfers Overall transfer level: Needs assistance Equipment used: None Transfers: Sit to/from Stand Sit to Stand: Min guard           General transfer comment: Min Guard A for safety      Balance Overall balance assessment: Needs assistance Sitting-balance support: Feet supported Sitting balance-Leahy Scale: Fair     Standing balance support: No upper extremity supported Standing balance-Leahy Scale: Fair                             ADL either performed or assessed with clinical judgement   ADL Overall ADL's : Needs assistance/impaired Eating/Feeding: Independent   Grooming: Supervision/safety;Standing   Upper Body Bathing: Standing;Min guard   Lower Body Bathing: Min guard;Sit to/from stand Lower Body Bathing Details (indicate cue type and reason): Min Guard A due to decreased balance and safety awareness Upper Body Dressing : Sitting;Set up   Lower Body Dressing: Min guard;Sit to/from stand Lower Body Dressing Details (indicate cue type and reason): Min Guard A due to decreased balance and safety awareness Toilet Transfer: Min guard;Ambulation;Regular Glass blower/designer Details (indicate cue type and reason): Min Guard A due to decreased balance and safety awareness Toileting- Clothing Manipulation and Hygiene: Min guard;Sit to/from stand       Functional mobility during ADLs: Cueing for safety;Min guard General ADL Comments: Functional performancei mapcted by decreased balance and cognition  Vision Baseline Vision/History: 1 Wears glasses Ability to See in Adequate Light: 0 Adequate Vision Assessment?: No apparent visual deficits     Perception     Praxis      Pertinent Vitals/Pain Pain Assessment Pain Assessment: No/denies  pain     Hand Dominance Right   Extremity/Trunk Assessment Upper Extremity Assessment Upper Extremity Assessment: Overall WFL for tasks assessed   Lower Extremity Assessment Lower Extremity Assessment: Defer to PT evaluation   Cervical / Trunk Assessment Cervical / Trunk Assessment: Normal   Communication Communication Communication: No difficulties   Cognition Arousal/Alertness: Awake/alert Behavior During Therapy: WFL for tasks assessed/performed Overall Cognitive Status: Impaired/Different from baseline                           Safety/Judgement: Decreased awareness of deficits   Problem Solving: Requires verbal cues       General Comments  Husband present during session. BP: sitting 106/61, standing 104/62, standing 3 min 94/58, and after mobility 100/73    Exercises     Shoulder Instructions      Home Living Family/patient expects to be discharged to:: Private residence Living Arrangements: Spouse/significant other Available Help at Discharge: Family Type of Home: House Home Access: Stairs to enter Technical brewer of Steps: 5 Entrance Stairs-Rails: Can reach both Home Layout: One level     Bathroom Shower/Tub: Occupational psychologist: Standard Bathroom Accessibility: Yes   Home Equipment: Hand held shower head   Additional Comments: works from home and has been riding horses with 60# saddles to get ready      Prior Functioning/Environment Prior Level of Function : Independent/Modified Independent;Driving;Working/employed             Mobility Comments: no AD needed previously ADLs Comments: Pt reported indepdent with ADLs/IADLs, works on her a farm and remotely for an organization        OT Problem List: Decreased activity tolerance;Impaired balance (sitting and/or standing);Decreased cognition;Decreased safety awareness      OT Treatment/Interventions: Self-care/ADL training;Therapeutic activities;Cognitive  remediation/compensation;Patient/family education;Balance training    OT Goals(Current goals can be found in the care plan section) Acute Rehab OT Goals Patient Stated Goal: go home OT Goal Formulation: With patient Time For Goal Achievement: 09/22/21 Potential to Achieve Goals: Good  OT Frequency: Min 3X/week    Co-evaluation              AM-PAC OT "6 Clicks" Daily Activity     Outcome Measure Help from another person eating meals?: None Help from another person taking care of personal grooming?: A Little Help from another person toileting, which includes using toliet, bedpan, or urinal?: A Little Help from another person bathing (including washing, rinsing, drying)?: A Little Help from another person to put on and taking off regular upper body clothing?: A Little Help from another person to put on and taking off regular lower body clothing?: A Little 6 Click Score: 19   End of Session Equipment Utilized During Treatment: Gait belt Nurse Communication: Mobility status  Activity Tolerance: Patient tolerated treatment well Patient left: in bed;with family/visitor present;with call bell/phone within reach  OT Visit Diagnosis: Unsteadiness on feet (R26.81);History of falling (Z91.81);Other symptoms and signs involving cognitive function                Time: 1535-1611 OT Time Calculation (min): 36 min Charges:  OT General Charges $OT Visit: 1 Visit OT Evaluation $OT Eval Moderate Complexity: 1 Mod  OT Treatments $Self Care/Home Management : 8-22 mins  Herschell Dimes, OTS 09/08/2021, 5:58 PM

## 2021-09-08 NOTE — Assessment & Plan Note (Signed)
Likely cause of cough, nasal congestion, and fever. Has been going on several days. Will give symptomatic relief, antipyretics.  Abnormal CTscan:partial imaging of apical lung fields revealed tree-in-bud pathology consistent with viral illness most [place] although subacute or chronic aspiration is in differential. Given other viral sx, I suspect itis related to viral illness. If cough resolves over next 1-2 weeks, doubt she needs specific follow up imaging for this but will include in dc summary

## 2021-09-08 NOTE — ED Notes (Signed)
Patient transported to CT 

## 2021-09-08 NOTE — ED Notes (Signed)
Warm blankets provided.

## 2021-09-08 NOTE — Evaluation (Signed)
Physical Therapy Evaluation Patient Details Name: Katherine Winters MRN: [id number] DOB: 10/31/1965 Today's Date: 09/08/2021  History of Present Illness  56 yo female with onset of syncopal episode was admitted 6/5, noted had been having fever and weakness with cough for a week prior.  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  Pt had injury to back of head with fall, not [facility].  Did have recent UTI, some orthostasis.  PMHx:  hypothyroidism,  Clinical Impression  Pt was seen for mobility [facility]  anterior interhemispheric fissure and layering atop the anterior corpus callosum. Pt is concerned about the findings of all her testing and assured her MD would give her all the results as they are available.  Pt asked about returning to horseback riding, but discouraged pt from considering riding a horse until MD clears her to do so, [facility] that was not attributed to her fall and striking the head.  Follow acutely for balance training and safety with gait as outlined in POC with acute PT goals as are outlined below.     Recommendations for follow up therapy are one component of a multi-disciplinary discharge planning process, led by the attending physician.  Recommendations may be updated based on patient status, additional functional criteria and insurance authorization.  Follow Up Recommendations Home health PT    Assistance Recommended at Discharge Intermittent Supervision/Assistance  Patient can return home with the following  A little help with walking and/or transfers;A little help with bathing/dressing/bathroom;Assistance with cooking/housework;Assist for transportation;Help with stairs or ramp for entrance    Equipment Recommendations None recommended by PT  Recommendations for Other Services       Functional Status Assessment Patient has had a recent decline  in their functional status and demonstrates the ability to make significant improvements in function in a reasonable and predictable amount of time.     Precautions / Restrictions Precautions Precautions: Fall Precaution Comments: monitor vitals Restrictions Weight Bearing Restrictions: No      Mobility  Bed Mobility Overal bed mobility: Modified Independent             General bed mobility comments: no difficulty to get into bed    Transfers Overall transfer level: Needs assistance Equipment used: 1 person hand held assist Transfers: Sit to/from Stand Sit to Stand: Supervision           General transfer comment: supervised for safety    Ambulation/Gait Ambulation/Gait assistance: Min guard, Supervision Gait Distance (Feet): 140 Feet Assistive device: 1 person hand held assist (min guard for safety only) Gait Pattern/deviations: Step-through pattern, Decreased stride length, Wide base of support Gait velocity: reduced Gait velocity interpretation: <1.31 ft/sec, indicative of household ambulator Pre-gait activities: standing balance ck General Gait Details: slow pace at times and slow turns at times  Stairs            Wheelchair Mobility    Modified Rankin (Stroke Patients Only) Modified Rankin (Stroke Patients Only) Pre-Morbid Rankin Score: No symptoms Modified Rankin: Slight disability     Balance Overall balance assessment: Needs assistance Sitting-balance support: Feet supported Sitting balance-Leahy Scale: Fair     Standing balance support: No upper extremity supported Standing balance-Leahy Scale: Fair                   Standardized Balance Assessment Standardized Balance Assessment : [employer] Balance Test Sharlene M[employer]Berg  Balance Test Sit to Stand: Able to stand without using hands and stabilize independently Standing Unsupported: Able to stand safely 2 minutes Sitting with Back Unsupported but Feet Supported on Floor or Stool: Able to sit  safely and securely 2 minutes Stand to Sit: Sits safely with minimal use of hands Transfers: Able to transfer safely, minor use of hands Standing Unsupported with Eyes Closed: Able to stand 10 seconds with supervision Standing Ubsupported with Feet Together: Able to place feet together independently and stand 1 minute safely From Standing, Reach Forward with Outstretched Arm: Can reach confidently >25 cm (10") From Standing Position, Pick up Object from Floor: Able to pick up shoe safely and easily From Standing Position, Turn to Look Behind Over each Shoulder: Looks behind one side only/other side shows less weight shift Turn 360 Degrees: Able to turn 360 degrees safely in 4 seconds or less Standing Unsupported, Alternately Place Feet on Step/Stool: Able to stand independently and safely and complete 8 steps in 20 seconds Standing Unsupported, One Foot in Front: Able to take small step independently and hold 30 seconds Standing on One Leg: Able to lift leg independently and hold equal to or more than 3 seconds Total Score: 50         Pertinent Vitals/Pain Pain Assessment Pain Assessment: No/denies pain    Home Living Family/patient expects to be discharged to:: Private residence Living Arrangements: Spouse/significant other Available Help at Discharge: Available PRN/intermittently Type of Home: House         Home Layout: One level Home Equipment: None Additional Comments: works from home and has been riding horses with 60# saddles to get ready    Prior Function Prior Level of Function : Independent/Modified Independent;Driving;Working/employed             Mobility Comments: no AD needed previously       Hand Dominance   Dominant Hand: Right    Extremity/Trunk Assessment   Upper Extremity Assessment Upper Extremity Assessment: Overall WFL for tasks assessed    Lower Extremity Assessment Lower Extremity Assessment: Overall WFL for tasks assessed    Cervical /  Trunk Assessment Cervical / Trunk Assessment: Normal  Communication   Communication: Expressive difficulties  Cognition Arousal/Alertness: Awake/alert Behavior During Therapy: WFL for tasks assessed/performed Overall Cognitive Status: Impaired/Different from baseline Area of Impairment: Safety/judgement, Problem solving                         Safety/Judgement: Decreased awareness of safety, Decreased awareness of deficits   Problem Solving: Requires verbal cues General Comments: pt chooses words slowly or incorrectly        General Comments General comments (skin integrity, edema, etc.): pt received balance testing wtih light issues, mild instability    Exercises     Assessment/Plan    PT Assessment Patient needs continued PT services  PT Problem List Decreased balance;Decreased mobility       PT Treatment Interventions DME instruction;Gait training;Stair training;Functional mobility training;Therapeutic activities;Therapeutic exercise;Balance training;Neuromuscular re-education;Patient/family education    PT Goals (Current goals can be found in the Care Plan section)  Acute Rehab PT Goals Patient Stated Goal: to get home and ride horses again PT Goal Formulation: With patient/family Time For Goal Achievement: 09/15/21 Potential to Achieve Goals: Good    Frequency Min 3X/week     Co-evaluation               AM-PAC PT "6 Clicks" Mobility  Outcome Measure Help needed turning  from your back to your side while in a flat bed without using bedrails?: None Help needed moving from lying on your back to sitting on the side of a flat bed without using bedrails?: None Help needed moving to and from a bed to a chair (including a wheelchair)?: A Little Help needed standing up from a chair using your arms (e.g., wheelchair or bedside chair)?: A Little Help needed to walk in hospital room?: A Little Help needed climbing 3-5 steps with a railing? : A Lot 6 Click  Score: 19    End of Session Equipment Utilized During Treatment: Gait belt Activity Tolerance: Patient tolerated treatment well Patient left: in bed;with call bell/phone within reach;with family/visitor present Nurse Communication: Mobility status PT Visit Diagnosis: Other abnormalities of gait and mobility (R26.89)    Time: 0100-7121 PT Time Calculation (min) (ACUTE ONLY): 24 min   Charges:   PT Evaluation $PT Eval Moderate Complexity: 1 Mod PT Treatments $Neuromuscular Re-education: 8-22 mins       Ivar Drape 09/08/2021, 3:06 PM  Samul Dada, PT PhD Acute Rehab Dept. Number: [facility] [id number] and [facility] [phone]

## 2021-09-08 NOTE — Progress Notes (Signed)
Family Medicine Teaching Service Daily Progress Note Intern Pager: [phone]  Patient name: Katherine Winters Medical record number: [id number] Date of birth: [birth year] Age: 56 y.o. Gender: female  Primary Care Provider: Ladora Daniel, PA-C Consultants: Neurosurgery Code Status: Full  Pt Overview and Major Events to Date:  09/07/2021-admitted 09/07/2021-head CT shows acute subarachnoid hemorrhage  Assessment and Plan: Katherine Winters is a 56 y.o. female without significant PMHx who presented with syncope and found to have subarachnoid hemorrhage.   [facility] SAH confirmed by CT, CTA showed no evidence of aneurysm.  Neurosurgery is following, their note indicates SAH more likely related to aneurysm than fall from syncope and recommend observation, no surgical intervention at this time.     -Neurology following, appreciate recs -Continuous cardiac monitoring -PT/OT eval and treat -Neurochecks every 2 hours -Fall precaution -Routine vitals -SCDs for VTE prophylaxis  Syncope Patient became hypotensive earlier this morning while lying in bed resting, BP as low as 79/48.  This significantly and quickly improved on its own with blood pressures in the low 100s/60s.  Orthostatic vitals completed today and positive with a diastolic drop of 20. Syncope could be d/t combination of subarachnoid hemorrhage, orthostatic hypotension, and possibly viral illness. Patient encouraged to increase p.o. fluid intake along with breakfast, will increase to mIVF and give fluid bolus this morning. If orthostatic vitals persist after fluid repletion, consider cards consult. Echocardiogram was ordered to rule out cardiac causes of syncope and will be completed today.  -Follow-up echo -Strict I/Os - LR 120 mL/hr -Fluid bolus 1 L LR  URI symptoms Pt with 1 week of URI symptoms and was febrile yesterday. CXR not suggestive of PNA, will get chest CT to r/o bacterial PNA. -f/u chest CT  FEN/GI: Regular diet PPx: SCDs Dispo:  Pending continued medical management  Subjective:  Patient states she is feeling much better this morning, says she was lying flat and resting when her BP dropped earlier. She asked her husband to bring her a breakfast biscuit with ham to get some salt in. She is thirsty and is drinking water.   Objective: Temp:  [98.3 F (36.8 C)-102.5 F (39.2 C)] 100.3 F (37.9 C) (06/05 1815) Pulse Rate:  [73-99] 74 (06/06 0645) Resp:  [13-24] 14 (06/06 0645) BP: (79-124)/(47-78) 79/48 (06/06 0645) SpO2:  [90 %-100 %] 93 % (06/06 0645) Weight:  [81.6 kg] 81.6 kg (06/05 0911) Physical Exam: General: 56 year old female, appears stated age, pleasant speak with, NAD Cardiovascular: RRR, normal S1/S2 Respiratory: CTAB, normal effort Abdomen: Bowel sounds present, soft, nontender to palpation, nondistended Extremities: 5/5 muscle strength of BLEs and BUEs Neuro: Cranial nerves II through XII intact, sensation intact, finger-nose-finger intact  Laboratory: Recent Labs  Lab 09/04/21 1530 09/07/21 0915 09/08/21 0258  WBC 2.8* 4.4 4.1  HGB 13.4 12.6 11.7*  HCT 41.7 38.3 36.1  PLT 132* 112* 129*   Recent Labs  Lab 09/04/21 1530 09/07/21 0915 09/08/21 0258  NA 133* 132* 133*  K 3.7 3.3* 3.7  CL 97* 93* 102  CO2 30 29 24   BUN 16 14 11   CREATININE 0.86 0.87 0.65  CALCIUM 8.9 8.7* 8.0*  PROT  --  6.5  --   BILITOT  --  0.7  --   ALKPHOS  --  52  --   ALT  --  43  --   AST  --  58*  --   GLUCOSE 100* 131* 105 , DO 09/08/2021, 7:06 AM PGY-1, [place]  [place] Intern pager: [phone], text pages welcome

## 2021-09-08 NOTE — Assessment & Plan Note (Signed)
No telemetry events overnight. Continue monitor. Blood pressure dropped into 90 systolic range and resolved with fluid bolue which supports likely mild dehydration onadmission from poor PO intake subsequent to viral illness.

## 2021-09-08 NOTE — Progress Notes (Signed)
CALL PAGER [phone] for any questions or notifications regarding this patient  FMTS Attending Note: Denny Levy MD * Syncope and collapse No telemetry events overnight. Continue monitor. Blood pressure dropped into 90 systolic range and resolved with fluid bolue which supports likely mild dehydration onadmission from poor PO intake subsequent to viral illness.  Viral illness Likely cause of cough, nasal congestion, and fever. Has been going on several days. Will give symptomatic relief, antipyretics.  Abnormal CTscan:partial imaging of apical lung fields revealed tree-in-bud pathology consistent with viral illness most [place] although subacute or chronic aspiration is in differential. Given other viral sx, I suspect itis related to viral illness. If cough resolves over next 1-2 weeks, doubt she needs specific follow up imaging for this but will include in dc summary  Subarachnoid hemorrhage (HCC) . [facility] has reviewed case and recommended observation. Await firther recommendations from them.

## 2021-09-08 NOTE — ED Notes (Signed)
Lunch order placed

## 2021-09-08 NOTE — Assessment & Plan Note (Addendum)
.   [facility] has reviewed case and recommended observation. Await firther recommendations from them.

## 2021-09-08 NOTE — ED Notes (Signed)
ED TO INPATIENT HANDOFF REPORT   S Name/Age/Gender Katherine Winters 56 y.o. female Room/Bed: 019C/019C  Code Status   Code Status: Full Code  Home/SNF/Other Home Patient oriented to: self, place, time, and situation Is this baseline? Yes   Triage Complete: Triage complete  Chief Complaint Syncope and collapse [R55]  Triage Note Pt states she was seen here recently her for UTI and feeling unwell. States this morning she went to sit outback, passed out and fell. Pt hit back of her head and endorses soreness there. Pt ambulatory to room with assistance, pt states she felt unsteady.    Allergies No Known Allergies  Level of Care/Admitting Diagnosis ED Disposition     ED Disposition  Admit   Condition  --   Comment  Hospital Area: [place] [[id number]]  Level of Care: Progressive [102]  Admit to Progressive based on following criteria: NEUROLOGICAL AND NEUROSURGICAL complex patients with significant risk of instability, who do not meet ICU criteria, yet require close observation or frequent assessment (< / = every 2 - 4 hours) with medical / nursing intervention.  May admit patient to [facility] or [facility] if equivalent level of care is available:: No  Covid Evaluation: Asymptomatic - no recent exposure (last 10 days) testing not required  Diagnosis: Syncope and collapse [780.2.ICD-9-CM]  Admitting Physician: NEAL, [place]  Attending Physician: Dickie La [[id number]]  Estimated length of stay: past midnight tomorrow  Certification:: I certify this patient will need inpatient services for at least 2 midnights          B Medical/Surgery History Past Medical History:  Diagnosis Date   Thyroid disease    Past Surgical History:  Procedure Laterality Date   NASAL SINUS SURGERY       A IV Location/Drains/Wounds Patient Lines/Drains/Airways Status     Active Line/Drains/Airways     Name Placement date Placement time Site Days   Peripheral IV  09/07/21 20 G 1" Anterior;Left;Proximal Forearm 09/07/21  1032  Forearm  1            Intake/Output Last 24 hours No intake or output data in the 24 hours ending 09/08/21 1053  Labs/Imaging Results for orders placed or performed during the hospital encounter of 09/07/21 (from the past 48 hour(s))  Comprehensive metabolic panel     Status: Abnormal   Collection Time: 09/07/21  9:15 AM  Result Value Ref Range   Sodium 132 (L) 135 - 145 mmol/L   Potassium 3.3 (L) 3.5 - 5.1 mmol/L   Chloride 93 (L) 98 - 111 mmol/L   CO2 29 22 - 32 mmol/L   Glucose, Bld 131 (H) 70 - 99 mg/dL    Comment: Glucose reference range applies only to samples taken after fasting for at least 8 hours.   BUN 14 6 - 20 mg/dL   Creatinine, Ser 0.87 0.44 - 1.00 mg/dL   Calcium 8.7 (L) 8.9 - 10.3 mg/dL   Total Protein 6.5 6.5 - 8.1 g/dL   Albumin 3.6 3.5 - 5.0 g/dL   AST 58 (H) 15 - 41 U/L   ALT 43 0 - 44 U/L   Alkaline Phosphatase 52 38 - 126 U/L   Total Bilirubin 0.7 0.3 - 1.2 mg/dL   GFR, Estimated >60 >60 mL/min    Comment: (NOTE) Calculated using the CKD-EPI Creatinine Equation (2021)    Anion gap 10 5 - 15    Comment: Performed at [facility], [place]  [place], [place], [place] [address]  CBC     Status: Abnormal   Collection Time: 09/07/21  9:15 AM  Result Value Ref Range   WBC 4.4 4.0 - 10.5 K/uL   RBC 4.36 3.87 - 5.11 MIL/uL   Hemoglobin 12.6 12.0 - 15.0 g/dL   HCT 38.3 36.0 - 46.0 %   MCV 87.8 80.0 - 100.0 fL   MCH 28.9 26.0 - 34.0 pg   MCHC 32.9 30.0 - 36.0 g/dL   RDW 13.9 11.5 - 15.5 %   Platelets 112 (L) 150 - 400 K/uL    Comment: SPECIMEN CHECKED FOR CLOTS Immature Platelet Fraction may be clinically indicated, consider ordering this additional test [id number] CONSISTENT WITH PREVIOUS RESULT REPEATED TO VERIFY    nRBC 0.0 0.0 - 0.2 %    Comment: Performed at [facility], [address] [place], [place] [address]  D-dimer, quantitative      Status: Abnormal   Collection Time: 09/07/21  9:15 AM  Result Value Ref Range   D-Dimer, Quant 3.08 (H) 0.00 - 0.50 ug/mL-FEU    Comment: (NOTE) At the manufacturer cut-off value of 0.5 g/mL FEU, this assay has a negative predictive value of 95-100%.This assay is intended for use in conjunction with a clinical pretest probability (PTP) assessment model to exclude pulmonary embolism (PE) and deep venous thrombosis (DVT) in outpatients suspected of PE or DVT. Results should be correlated with clinical presentation. Performed at [facility], [address] [place], [place] [address]   SARS Coronavirus 2 by RT PCR (hospital order, performed in [facility] hospital lab) *cepheid single result test* Anterior Nasal Swab     Status: None   Collection Time: 09/07/21 11:40 AM   Specimen: Anterior Nasal Swab  Result Value Ref Range   SARS Coronavirus 2 by RT PCR NEGATIVE NEGATIVE    Comment: (NOTE) SARS-CoV-2 target nucleic acids are NOT DETECTED.  The SARS-CoV-2 RNA is generally detectable in upper and lower respiratory specimens during the acute phase of infection. The lowest concentration of SARS-CoV-2 viral copies this assay can detect is 250 copies / mL. A negative result does not preclude SARS-CoV-2 infection and should not be used as the sole basis for treatment or other patient management decisions.  A negative result may occur with improper specimen collection / handling, submission of specimen other than nasopharyngeal swab, presence of viral mutation(s) within the areas targeted by this assay, and inadequate number of viral copies (<250 copies / mL). A negative result must be combined with clinical observations, patient history, and epidemiological information.  Fact Sheet for Patients:   https://www.patel.info/  Fact Sheet for Healthcare Providers: https://hall.com/  This test is not yet approved or  cleared by  the [place] FDA and has been authorized for detection and/or diagnosis of SARS-CoV-2 by FDA under an Emergency Use Authorization (EUA).  This EUA will remain in effect (meaning this test can be used) for the duration of the COVID-19 declaration under Section 564(b)(1) of the Act, 21 U.S.C. section 360bbb-3(b)(1), unless the authorization is terminated or revoked sooner.  Performed at [facility], [address], [place], [place] [address]   Pregnancy, urine     Status: None   Collection Time: 09/07/21 12:00 PM  Result Value Ref Range   Preg Test, Ur NEGATIVE NEGATIVE    Comment:        THE SENSITIVITY OF THIS METHODOLOGY IS >20 mIU/mL. Performed at [facility], [address], [place], [place] [address]  HIV Antibody (routine testing w rflx)     Status: None   Collection Time: 09/07/21  6:40 PM  Result Value Ref Range   HIV Screen 4th Generation wRfx Non Reactive Non Reactive    Comment: (NOTE) HIV Negative HIV-1/HIV-2 antibodies and HIV-1 p24 antigen were NOT detected. There is no laboratory evidence of HIV infection. Performed At: [facility] [place], [place] [id number] Rush Farmer MD [id number]   Blood culture (routine x 2)     Status: None (Preliminary result)   Collection Time: 09/07/21  6:50 PM   Specimen: BLOOD RIGHT HAND  Result Value Ref Range   Specimen Description BLOOD RIGHT HAND    Special Requests      BOTTLES DRAWN AEROBIC AND ANAEROBIC Blood Culture adequate volume   Culture      NO GROWTH < 24 HOURS Performed at [facility], [place] [address], [place], [place] [address]    Report Status PENDING   Blood culture (routine x 2)     Status: None (Preliminary result)   Collection Time: 09/07/21  6:50 PM   Specimen: BLOOD RIGHT HAND  Result Value Ref Range   Specimen Description BLOOD RIGHT HAND    Special Requests      BOTTLES DRAWN AEROBIC AND ANAEROBIC Blood Culture  adequate volume   Culture      NO GROWTH < 24 HOURS Performed at [facility], [place] [address], [place], [place] [address]    Report Status PENDING   Basic metabolic panel     Status: Abnormal   Collection Time: 09/08/21  2:58 AM  Result Value Ref Range   Sodium 133 (L) 135 - 145 mmol/L   Potassium 3.7 3.5 - 5.1 mmol/L   Chloride 102 98 - 111 mmol/L   CO2 24 22 - 32 mmol/L   Glucose, Bld 105 (H) 70 - 99 mg/dL    Comment: Glucose reference range applies only to samples taken after fasting for at least 8 hours.   BUN 11 6 - 20 mg/dL   Creatinine, Ser 0.65 0.44 - 1.00 mg/dL   Calcium 8.0 (L) 8.9 - 10.3 mg/dL   GFR, Estimated >60 >60 mL/min    Comment: (NOTE) Calculated using the CKD-EPI Creatinine Equation (2021)    Anion gap 7 5 - 15    Comment: Performed at [facility] [address], [place], [place] [address]  CBC     Status: Abnormal   Collection Time: 09/08/21  2:58 AM  Result Value Ref Range   WBC 4.1 4.0 - 10.5 K/uL   RBC 4.05 3.87 - 5.11 MIL/uL   Hemoglobin 11.7 (L) 12.0 - 15.0 g/dL   HCT 36.1 36.0 - 46.0 %   MCV 89.1 80.0 - 100.0 fL   MCH 28.9 26.0 - 34.0 pg   MCHC 32.4 30.0 - 36.0 g/dL   RDW 13.8 11.5 - 15.5 %   Platelets 129 (L) 150 - 400 K/uL    Comment: REPEATED TO VERIFY   nRBC 0.0 0.0 - 0.2 %    Comment: Performed at [facility], [place] [address], [place], [place] [address]   CT ANGIO HEAD NECK W WO CM  Result Date: 09/07/2021 CLINICAL DATA:  Syncope/presyncope, cerebrovascular cause suspected SAH on CTH EXAM: CT ANGIOGRAPHY HEAD AND NECK TECHNIQUE: Multidetector CT imaging of the head and neck was performed using the standard protocol during bolus administration of intravenous contrast. Multiplanar CT image reconstructions and MIPs  were obtained to evaluate the vascular anatomy. Carotid stenosis measurements (when applicable) are obtained utilizing NASCET criteria, using the distal internal carotid diameter as the denominator. RADIATION DOSE  REDUCTION: This exam was performed according to the departmental dose-optimization program which includes automated exposure control, adjustment of the mA and/or kV according to patient size and/or use of iterative reconstruction technique. CONTRAST:  68mL OMNIPAQUE IOHEXOL 350 MG/ML SOLN COMPARISON:  CT head from the same day. FINDINGS: CTA NECK FINDINGS Aortic arch: Great vessel origins are patent without proximal hemodynamically significant stenosis. Right carotid system: No evidence of dissection, stenosis (50% or greater) or occlusion. Left carotid system: No evidence of dissection, stenosis (50% or greater) or occlusion. Vertebral arteries: Codominant. No evidence of dissection, stenosis (50% or greater) or occlusion. Skeleton: Moderate degenerative disease at C5-C6. Other neck: Upper chest: Multiple nodular opacities in the visualized right upper lobe measuring up to 7 mm with mild surrounding tree-in-bud opacities Review of the MIP images confirms the above findings CTA HEAD FINDINGS Anterior circulation: Bilateral intracranial ICAs, MCAs, and ACAs are patent without proximal hemodynamically significant stenosis. No aneurysm identified. Posterior circulation: Bilateral intradural vertebral arteries, basilar artery and bilateral posterior cerebral arteries are patent without proximal hemodynamically significant stenosis. Right fetal type PCA, anatomic variant. No aneurysm identified. Venous sinuses: As permitted by contrast timing, patent. Anatomic variants: Described above. Review of the MIP images confirms the above findings IMPRESSION: 1. No emergent large vessel occlusion, proximal hemodynamically significant stenosis, or evidence of aneurysm. 2. Multiple nodular opacities in the visualized right upper lobe measuring up to 7 mm with mild surrounding tree-in-bud opacities. Findings could be infectious/inflammatory in etiology; however, recommend non-contrast chest CT at 3-6 months to ensure resolution. If  the nodules are stable at time of repeat CT, then future CT at 18-24 months (from today's scan) is considered optional for low-risk patients, but is recommended for high-risk patients. This recommendation follows the consensus statement: Guidelines for Management of Incidental Pulmonary Nodules Detected on CT Images: From the Fleischner Society 2017; Radiology 2017; 284:228-243. Electronically Signed   By: Margaretha Sheffield M.D.   On: 09/07/2021 13:21   DG Chest 2 View  Result Date: 09/07/2021 CLINICAL DATA:  Cough.  Loss of consciousness. EXAM: CHEST - 2 VIEW COMPARISON:  Chest two views 09/04/2021 FINDINGS: Cardiac silhouette and mediastinal contours are within normal limits with mild calcification again seen within the aortic arch. Minimal lateral lower left lung horizontal subsegmental atelectasis versus scarring likely within the lingula appears new from prior. On lateral view no focal airspace opacity is seen to indicate pneumonia. No pleural effusion or pneumothorax. Mild multilevel degenerative disc changes of the thoracic spine. IMPRESSION: Mild horizontal linear subsegmental atelectasis versus scarring within the lingula on frontal view. No focal airspace opacity to indicate pneumonia. Electronically Signed   By: Yvonne Kendall M.D.   On: 09/07/2021 12:05   CT Head Wo Contrast  Result Date: 09/07/2021 CLINICAL DATA:  Blunt trauma, passed out today, fell striking back of head, unsteady standing, recently treated for UTI EXAM: CT HEAD WITHOUT CONTRAST TECHNIQUE: Contiguous axial images were obtained from the base of the skull through the vertex without intravenous contrast. RADIATION DOSE REDUCTION: This exam was performed according to the departmental dose-optimization program which includes automated exposure control, adjustment of the mA and/or kV according to patient size and/or use of iterative reconstruction technique. COMPARISON:  None Available. FINDINGS: Brain: Normal ventricular morphology. No  midline shift or mass effect. Subarachnoid hemorrhage identified within the anterior interhemispheric  fissure and layering atop the corpus callosum. No intraparenchymal hemorrhage, mass lesion, or evidence of acute infarction. No additional extra-axial fluid collections. Vascular: No hyperdense vessels Skull: Calvaria intact.  Posterior RIGHT parietal scalp hematoma. Sinuses/Orbits: Clear Other: N/A IMPRESSION: Acute subarachnoid hemorrhage at the anterior interhemispheric fissure and layering atop the anterior corpus callosum. No intraparenchymal abnormalities. Critical Value/emergent results were called by telephone at the time of interpretation on 09/07/2021 at 12:09 pm to provider [place], who verbally acknowledged these results. Electronically Signed   By: Lavonia Dana M.D.   On: 09/07/2021 12:10    Pending Labs Unresulted Labs (From admission, onward)    None       Vitals/Pain Today's Vitals   09/08/21 0915 09/08/21 0945 09/08/21 1000 09/08/21 1048  BP: 114/63 104/62 (!) 107/54 114/64  Pulse: 80 82 81 80  Resp: 17 (!) 21 19 19   Temp:    98.1 F (36.7 C)  TempSrc:      SpO2: 93% 96% 94% 96%  Weight:      Height:      PainSc:        Isolation Precautions No active isolations  Medications Medications  levothyroxine (SYNTHROID) tablet 75 mcg (75 mcg Oral Given 09/08/21 0548)  acetaminophen (TYLENOL) tablet 650 mg (has no administration in time range)    Or  acetaminophen (TYLENOL) suppository 650 mg (has no administration in time range)  acetaminophen-codeine (TYLENOL #3) 300-30 MG per tablet 1-2 tablet (2 tablets Oral Given 09/07/21 2126)  lactated ringers infusion ( Intravenous New Bag/Given 09/08/21 0911)  sodium chloride 0.9 % bolus 1,000 mL (0 mLs Intravenous Stopped 09/07/21 1502)  iohexol (OMNIPAQUE) 350 MG/ML injection 75 mL (75 mLs Intravenous Contrast Given 09/07/21 1256)  acetaminophen (TYLENOL) tablet 650 mg (650 mg Oral Given 09/07/21 1659)  potassium chloride  (KLOR-CON) packet 40 mEq (40 mEq Oral Given 09/07/21 2126)    Mobility walks with device Moderate fall risk   Focused Assessments Neuro Assessment Handoff:  Swallow screen pass? Yes    NIH Stroke Scale ( + Modified Stroke Scale Criteria)  LOC Questions (1b. )   +: Answers both questions correctly LOC Commands (1c. )   + : Performs both tasks correctly Best Gaze (2. )  +: Normal Visual (3. )  +: No visual loss Motor Arm, Left (5a. )   +: No drift Motor Arm, Right (5b. )   +: No drift Motor Leg, Left (6a. )   +: No drift Motor Leg, Right (6b. )   +: No drift Sensory (8. )   +: Normal, no sensory loss Best Language (9. )   +: No aphasia Extinction/Inattention (11.)   +: No Abnormality Modified SS Total  +: 0     Neuro Assessment: Within Defined Limits Neuro Checks:      Last Documented NIHSS Modified Score: 0 (09/08/21 0800) Has TPA been given? No If patient is a Neuro Trauma and patient is going to OR before floor call report to [place] nurse: [phone] or [phone]   R Recommendations: See Admitting Provider Note

## 2021-09-08 NOTE — Plan of Care (Signed)
  Problem: Education: Goal: Knowledge of General Education information will improve Description: Including pain rating scale, medication(s)/side effects and non-pharmacologic comfort measures 09/08/2021 2212 by Tracie Harrier, RN Outcome: Progressing 09/08/2021 2212 by Tracie Harrier, RN Outcome: Progressing   Problem: Health Behavior/Discharge Planning: Goal: Ability to manage health-related needs will improve 09/08/2021 2212 by Tracie Harrier, RN Outcome: Progressing 09/08/2021 2212 by Tracie Harrier, RN Outcome: Progressing   Problem: Clinical Measurements: Goal: Ability to maintain clinical measurements within normal limits will improve 09/08/2021 2212 by Tracie Harrier, RN Outcome: Progressing 09/08/2021 2212 by Tracie Harrier, RN Outcome: Progressing Goal: Will remain free from infection 09/08/2021 2212 by Tracie Harrier, RN Outcome: Progressing 09/08/2021 2212 by Tracie Harrier, RN Outcome: Progressing Goal: Diagnostic test results will improve 09/08/2021 2212 by Tracie Harrier, RN Outcome: Progressing 09/08/2021 2212 by Tracie Harrier, RN Outcome: Progressing Goal: Respiratory complications will improve 09/08/2021 2212 by Tracie Harrier, RN Outcome: Progressing 09/08/2021 2212 by Tracie Harrier, RN Outcome: Progressing Goal: Cardiovascular complication will be avoided 09/08/2021 2212 by Tracie Harrier, RN Outcome: Progressing 09/08/2021 2212 by Tracie Harrier, RN Outcome: Progressing   Problem: Activity: Goal: Risk for activity intolerance will decrease 09/08/2021 2212 by Tracie Harrier, RN Outcome: Progressing 09/08/2021 2212 by Tracie Harrier, RN Outcome: Progressing   Problem: Nutrition: Goal: Adequate nutrition will be maintained 09/08/2021 2212 by Tracie Harrier, RN Outcome: Progressing 09/08/2021 2212 by Tracie Harrier, RN Outcome: Progressing   Problem: Coping: Goal: Level of anxiety will decrease 09/08/2021 2212 by Tracie Harrier, RN Outcome: Progressing 09/08/2021 2212 by Tracie Harrier, RN Outcome: Progressing   Problem: Elimination: Goal: Will not experience complications related to bowel motility 09/08/2021 2212 by Tracie Harrier, RN Outcome: Progressing 09/08/2021 2212 by Tracie Harrier, RN Outcome: Progressing Goal: Will not experience complications related to urinary retention 09/08/2021 2212 by Tracie Harrier, RN Outcome: Progressing 09/08/2021 2212 by Tracie Harrier, RN Outcome: Progressing   Problem: Pain Managment: Goal: General experience of comfort will improve 09/08/2021 2212 by Tracie Harrier, RN Outcome: Progressing 09/08/2021 2212 by Tracie Harrier, RN Outcome: Progressing   Problem: Safety: Goal: Ability to remain free from injury will improve 09/08/2021 2212 by Tracie Harrier, RN Outcome: Progressing 09/08/2021 2212 by Tracie Harrier, RN Outcome: Progressing   Problem: Skin Integrity: Goal: Risk for impaired skin integrity will decrease 09/08/2021 2212 by Tracie Harrier, RN Outcome: Progressing 09/08/2021 2212 by Tracie Harrier, RN Outcome: Progressing

## 2021-09-08 NOTE — Progress Notes (Signed)
PT Cancellation Note  Patient Details Name: Katherine Winters MRN: [id number] DOB: Mar 10, 1966   Cancelled Treatment:    Reason Eval/Treat Not Completed: Other (comment).  In procedure, retry at another time.   Ramond Dial 09/08/2021, 12:15 PM  Mee Hives, PT PhD Acute Rehab Dept. Number: [place] and [place]

## 2021-09-09 ENCOUNTER — Other Ambulatory Visit (HOSPITAL_COMMUNITY): Payer: Self-pay

## 2021-09-09 DIAGNOSIS — I609 Nontraumatic subarachnoid hemorrhage, unspecified: Principal | ICD-10-CM

## 2021-09-09 LAB — CBC
HCT: 33 % — ABNORMAL LOW (ref 36.0–46.0)
Hemoglobin: 11.1 g/dL — ABNORMAL LOW (ref 12.0–15.0)
MCH: 29.8 pg (ref 26.0–34.0)
MCHC: 33.6 g/dL (ref 30.0–36.0)
MCV: 88.7 fL (ref 80.0–100.0)
Platelets: 190 10*3/uL (ref 150–400)
RBC: 3.72 MIL/uL — ABNORMAL LOW (ref 3.87–5.11)
RDW: 14 % (ref 11.5–15.5)
WBC: 4.2 10*3/uL (ref 4.0–10.5)
nRBC: 0 % (ref 0.0–0.2)

## 2021-09-09 LAB — CULTURE, BLOOD (ROUTINE X 2)
Culture: NO GROWTH
Culture: NO GROWTH
Special Requests: ADEQUATE
Special Requests: ADEQUATE

## 2021-09-09 MED ORDER — ACETAMINOPHEN-CODEINE 300-30 MG PO TABS
1.0000 | ORAL_TABLET | Freq: Four times a day (QID) | ORAL | 0 refills | Status: AC | PRN
  Filled 2021-09-09: qty 40, 5d supply, fill #0

## 2021-09-09 MED ORDER — AZITHROMYCIN 500 MG PO TABS
500.0000 mg | ORAL_TABLET | Freq: Every day | ORAL | 0 refills | Status: AC
  Filled 2021-09-09: qty 1, 1d supply, fill #0

## 2021-09-09 NOTE — TOC Transition Note (Signed)
Transition of Care [facility]) - CM/SW Discharge Note   Patient Details  Name: Katherine Winters MRN: [id number] Date of Birth: [birth year]  [facility] CM/SW Contact:  Pollie Friar, RN Phone Number: 09/09/2021, 11:41 AM   Clinical Narrative:    Pt is from home with her spouse. Spouse works next door to the home so is easily accessible if she needs something during the daytime.  No DME. She manages her own medications without any issues.  Spouse can provide needed transportation. Recommendations are for outpatient OT. Pt is currently refusing.  Spouse will provide transport home when medically ready.   Final next level of care: Home/Self Care Barriers to Discharge: No Barriers Identified   Patient Goals and CMS Choice        Discharge Placement                       Discharge Plan and Services                                     Social Determinants of Health (SDOH) Interventions     Readmission Risk Interventions     View : No data to display.

## 2021-09-09 NOTE — Discharge Instructions (Addendum)
Thank you for allowing [place] to care for you during your stay.  We are so glad you are feeling better!  You were hospitalized due to passing out and having low blood pressure.  You were found to be dehydrated, found to have a subarachnoid hemorrhage (bleed in your brain), and found to have possible pneumonia.  You were treated with IV fluids and an antibiotic called azithromycin.  You will need to take 1 more dose of the azithromycin tomorrow.  You were seen by the neurosurgeon who did not recommend any intervention for the subarachnoid hemorrhage in the hospital. You are recommended to follow-up with neurosurgery in a few weeks, they will call to schedule the appointment. If you do not hear from then within a week, please call [facility] at [phone].  Please follow-up with your primary care doctor within the next week after discharge for hospital follow-up visit.  We recommend you drink plenty of fluids and get some rest after discharge. Do not go horse back riding until you follow up with the neurosurgeon.   Reasons to return Your blood pressure drops again, you feel light headed/pass out again, changes in your vision, severe headache.

## 2021-09-09 NOTE — Progress Notes (Signed)
FPTS Brief Progress Note  S: awake- had to use restroom. Overall reports feeling a bit better.   O: BP 107/72 (BP Location: Right Arm)   Pulse 84   Temp 97.7 F (36.5 C) (Oral)   Resp 18   Ht 5\' 6"  (1.676 m)   Wt 81.6 kg   SpO2 92%   BMI 29.05 kg/m     A/P: - Orders reviewed. Labs for AM ordered, which was adjusted as needed.   , MD 09/09/2021, 12:39 AM PGY-3, 11/09/2021 Health Family Medicine Night Resident  Please page [phone] with questions.

## 2021-09-09 NOTE — Discharge Summary (Signed)
Family Medicine Teaching [facility] Discharge Summary  Patient name: Katherine Winters Medical record number: [id number] Date of birth: [birth year] Age: 56 y.o. Gender: female Date of Admission: 09/07/2021  Date of Discharge: 09/09/21 Admitting Physician: Nestor Ramp, MD  Primary Care Provider: Ladora Daniel, PA-C Consultants: Neurosurgery  Indication for Hospitalization: Syncope  Discharge Diagnoses/Problem List:  Principal Problem:   Syncope and collapse Active Problems:   Subarachnoid hemorrhage (HCC)   pneumonia   Disposition: home  Discharge Condition: stable  Discharge Exam:  General: 56 year old female, well-appearing, NAD CV: RRR, normal S1/S2 Respiratory: CTAB, normal effort Abdomen: Bowel sounds present, soft, nontender palpation, nondistended Neuro: Alert, no focal deficits  Brief Hospital Course:  Katherine Winters is a 56 y.o. female without significant PMHx who presented after syncopal episode and found to have subarachnoid hemorrhage and likely pneumonia.   Subarachnoid hemorrhage Patient was admitted after syncopal episode where she hit her head.  Head CT showed acute subarachnoid hemorrhage at the anterior interhemispheric fissure and layering atop the anterior corpus callosum.  Neurosurgery was consulted and recommended no surgical intervention at this time.  They did not think based on location of hemorrhage that it was caused by the fall but could have been caused by an aneurysm.  CTA head and neck was done which did not show an aneurysm.  They recommended outpatient follow-up with neurosurgery in 2 to 3 weeks after discharge.  Syncope Patient was admitted for syncope work-up.  She had low blood pressures near the start of admission with positive orthostatic vitals. BP was as low as 70s/40s. This significantly improved after 1 L bolus of fluids and maintenance IV fluids.  Repeat orthostatic vitals were normal and patient was normotensive prior to discharge.   Echocardiogram was completed which showed an ejection fraction of 60-65%, normal left ventricular function with no regional wall abnormalities and normal diastolic parameters with no valvular pathology.  It did show trivial pericardial effusion.  Syncope was likely due to combination of low blood pressure in the setting of dehydration and pneumonia.  Patient was advised at discharge to push oral fluids to stay hydrated and was given return precautions  Pneumonia Patient reported week long symptoms of cough, congestion, fatigue, and fevers.  Labs showed no leukocytosis and CXR did not suggest PNA. CT chest showed small bilateral pleural effusions with bilateral upper and lower lobe reticulonodular opacities likely representing mild infection.  Patient was started on azithromycin 500 mg daily.  She was discharged with 1 more dose of azithromycin to complete a 3-day course. Of note, CTA also showed Multiple nodular opacities in the visualized right upper lobe measuring up to 7 mm, radiologist recommended non-contrast chest CT at 3-6 months to ensure resolution.   Issues for follow up No horseback riding until follow-up with neurosurgeon Ensure follow-up with neurosurgeon Ensure adequate p.o. intake to stay hydrated If patient does not get well, send to pulmonary for evaluation of possible pneumonitis.  Significant Procedures: none  Significant Labs and Imaging:  Recent Labs  Lab 09/07/21 0915 09/08/21 0258 09/09/21 0354  WBC 4.4 4.1 4.2  HGB 12.6 11.7* 11.1*  HCT 38.3 36.1 33.0*  PLT 112* 129* 190   Recent Labs  Lab 09/04/21 1530 09/07/21 0915 09/08/21 0258  NA 133* 132* 133*  K 3.7 3.3* 3.7  CL 97* 93* 102  CO2 30 29 24   GLUCOSE 100* 131* 105*  BUN 16 14 11   CREATININE 0.86 0.87 0.65  CALCIUM 8.9 8.7* 8.0*  ALKPHOS  --  52  --   AST  --  58*  --   ALT  --  43  --   ALBUMIN  --  3.6  --     none  Results/Tests Pending at Time of Discharge: none  Discharge Medications:   Allergies as of 09/09/2021   No Known Allergies      Medication List     STOP taking these medications    cephALEXin 500 MG capsule Commonly known as: KEFLEX   Delsym Cough/Chest Congest DM 5-100 MG/5ML Liqd Generic drug: Dextromethorphan-guaiFENesin   ibuprofen 200 MG tablet Commonly known as: ADVIL       TAKE these medications    acetaminophen-codeine 300-30 MG tablet Commonly known as: TYLENOL #3 Take 1-2 tablets by mouth every 6 (six) hours as needed for moderate pain (Cough).   azithromycin 500 MG tablet Commonly known as: ZITHROMAX Take 1 tablet (500 mg total) by mouth daily. Start taking on: September 10, 2021   levothyroxine 75 MCG tablet Commonly known as: SYNTHROID Take 75 mcg by mouth daily.   VITAMIN C PO Take 1 tablet by mouth daily.        Discharge Instructions: Please refer to Patient Instructions section of EMR for full details.  Patient was counseled important signs and symptoms that should prompt return to medical care, changes in medications, dietary instructions, activity restrictions, and follow up appointments.   Follow-Up Appointments:  Follow-up Information     Ladora Daniel, PA-C. Schedule an appointment as soon as possible for a visit in 1 week(s).   Specialty: Physician Assistant Contact information: [address] [place] [address] [phone]         Barnett Abu, MD Follow up in 2 week(s).   Specialty: Neurosurgery Why: They will call you to schedule an appointment for a few weeks from now. Contact information: [address] [place] [address] [phone]                 Erick Alley, DO 09/09/2021, 4:28 PM PGY-1, [facility]

## 2021-09-09 NOTE — Progress Notes (Signed)
Physical Therapy Treatment Patient Details Name: Katherine Winters MRN: [id number] DOB: [birth year] Today's Date: 09/09/2021   History of Present Illness pt  is 56 yo F admitted 6/5 for Syncope with loss of consciousness.  Episodes of Hypotension.  6/6 chest CT reveals bilateral pleural effusions, suspectful of mild infection or aspiration.  6/6 Head CT  reveals Acute subarachnoid hemorrhage at the anterior interhemispheric fissure and layering atop the anterior corpus callosum.  PMH Thyroid disease, nasal sinus surgery    PT Comments    Pt progressing well towards all goals. Pt functioning at mod I/supervision for mobility however does continue to demo some higher level cognitive deficits like following multistep commands and difficulty problem solving ie ordering food off menu. Acute PT to cont to follow.    Recommendations for follow up therapy are one component of a multi-disciplinary discharge planning process, led by the attending physician.  Recommendations may be updated based on patient status, additional functional criteria and insurance authorization.  Follow Up Recommendations  No PT follow up     Assistance Recommended at Discharge Intermittent Supervision/Assistance  Patient can return home with the following A little help with bathing/dressing/bathroom;Assistance with cooking/housework;Assist for transportation;Two people to help with walking and/or transfers   Equipment Recommendations  None recommended by PT    Recommendations for Other Services       Precautions / Restrictions Precautions Precautions: Fall Restrictions Weight Bearing Restrictions: No     Mobility  Bed Mobility Overal bed mobility: Modified Independent             General bed mobility comments: pt able to transfer in/out of bed with HOB flat    Transfers Overall transfer level: Needs assistance Equipment used: None Transfers: Sit to/from Stand Sit to Stand: Modified independent  (Device/Increase time)           General transfer comment: no difficulty or instability, awareness of IV pole    Ambulation/Gait Ambulation/Gait assistance: Supervision Gait Distance (Feet): 400 Feet Assistive device: None Gait Pattern/deviations: Wide base of support, WFL(Within Functional Limits) Gait velocity: reduced Gait velocity interpretation: >2.62 ft/sec, indicative of community ambulatory   General Gait Details: wfl, normal pace for pt   Stairs Stairs: Yes Stairs assistance: Min guard Stair Management: One rail Right, Alternating pattern Number of Stairs: 4 (limited by IV pole) General stair comments: no difficulty, asked how we were going to do the stairs with the IV pole demonstrating safety awareness   Wheelchair Mobility    Modified Rankin (Stroke Patients Only) Modified Rankin (Stroke Patients Only) Pre-Morbid Rankin Score: No symptoms Modified Rankin: No significant disability     Balance Overall balance assessment: Modified Independent                               Standardized Balance Assessment Standardized Balance Assessment : Dynamic Gait Index   Dynamic Gait Index Level Surface: Normal Change in Gait Speed: Normal Gait with Horizontal Head Turns: Normal Gait with Vertical Head Turns: Normal Gait and Pivot Turn: Normal Step Over Obstacle: Normal Step Around Obstacles: Mild Impairment Steps: Mild Impairment Total Score: 22      Cognition Arousal/Alertness: Awake/alert Behavior During Therapy: WFL for tasks assessed/performed Overall Cognitive Status: Impaired/Different from baseline Area of Impairment: Problem solving, Following commands                       Following Commands: Follows multi-step commands inconsistently  Problem Solving: Difficulty sequencing General Comments: pt with difficulty following multistep commands, could only follow simple 1 step commands, required increased time and verabl cues  to figure out how to order her breakfast off the menu        Exercises      General Comments General comments (skin integrity, edema, etc.): vss      Pertinent Vitals/Pain Pain Assessment Pain Assessment: No/denies pain    Home Living                          Prior Function            PT Goals (current goals can now be found in the care plan section) Acute Rehab PT Goals Patient Stated Goal: get home today PT Goal Formulation: With patient/family Time For Goal Achievement: 09/15/21 Potential to Achieve Goals: Good Progress towards PT goals: Progressing toward goals    Frequency    Min 3X/week      PT Plan Discharge plan needs to be updated    Co-evaluation              AM-PAC PT "6 Clicks" Mobility   Outcome Measure  Help needed turning from your back to your side while in a flat bed without using bedrails?: None Help needed moving from lying on your back to sitting on the side of a flat bed without using bedrails?: None Help needed moving to and from a bed to a chair (including a wheelchair)?: None Help needed standing up from a chair using your arms (e.g., wheelchair or bedside chair)?: None Help needed to walk in hospital room?: None Help needed climbing 3-5 steps with a railing? : A Little 6 Click Score: 23    End of Session Equipment Utilized During Treatment: Gait belt Activity Tolerance: Patient tolerated treatment well Patient left: with call bell/phone within reach;in chair Nurse Communication: Mobility status PT Visit Diagnosis: Other abnormalities of gait and mobility (R26.89)     Time: 1517-6160 PT Time Calculation (min) (ACUTE ONLY): 18 min  Charges:  $Gait Training: 8-22 mins                     Lewis Shock, PT, DPT Acute Rehabilitation Services Secure chat preferred Office #: [phone]    Katherine Winters 09/09/2021, 10:56 AM

## 2021-09-09 NOTE — Progress Notes (Signed)
Nursing DC note  Patient alert and oriented, verbalized understanding of dc instructions. All belongings given to patient. Ccmd notified of dc order

## 2021-09-12 LAB — CULTURE, BLOOD (ROUTINE X 2)
Culture: NO GROWTH
Culture: NO GROWTH
Special Requests: ADEQUATE
Special Requests: ADEQUATE

## 2023-10-10 IMAGING — CT CT HEAD W/O CM
4 series · 16 of 47 positions shown, 18 images · non-contrast
Comparison: None Available.

CLINICAL DATA: Blunt trauma, passed out today, fell striking back
of head, unsteady standing, recently treated for UTI



[Series 2: head wo · axial · 0.44mm/px · z∈[+1084,+1194]mm · 7 of 30 slices shown, 9 images]
[im 4/30  brain]
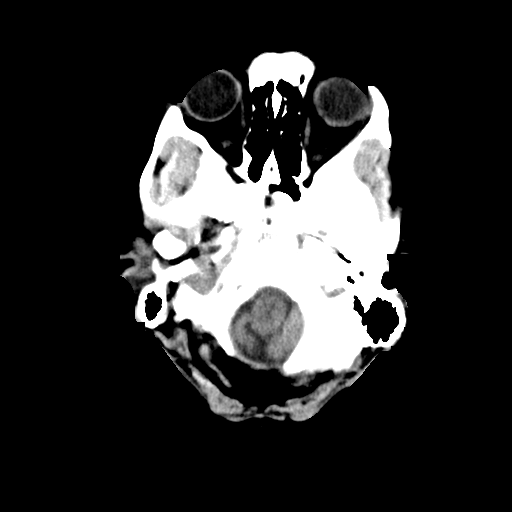
[im 4/30  bone]
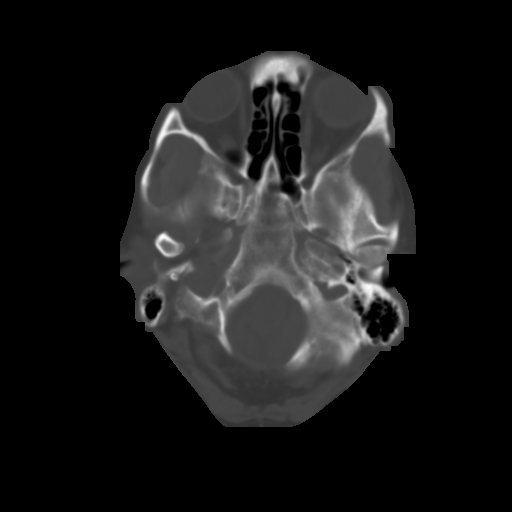
[im 8/30  brain]
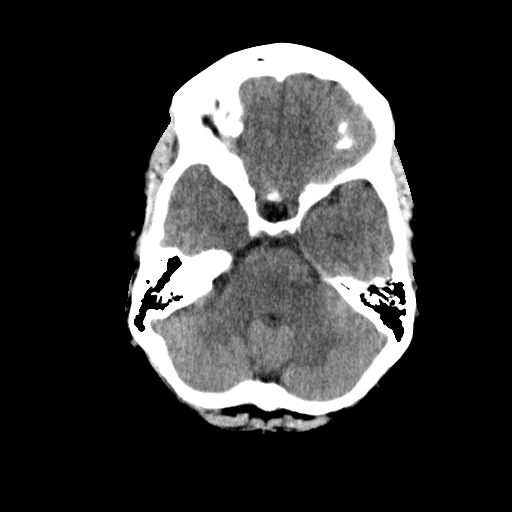
[im 11/30  brain]
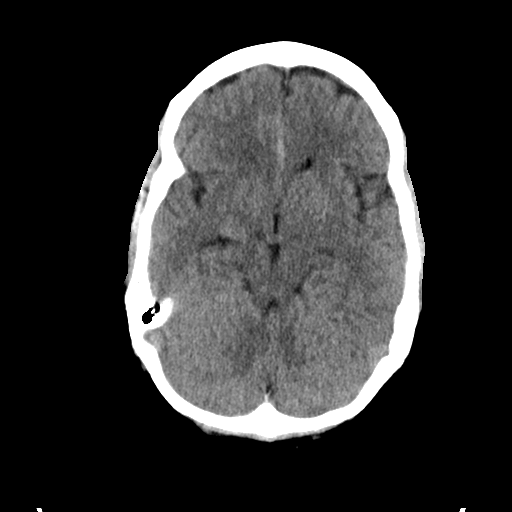
[im 15/30  brain]
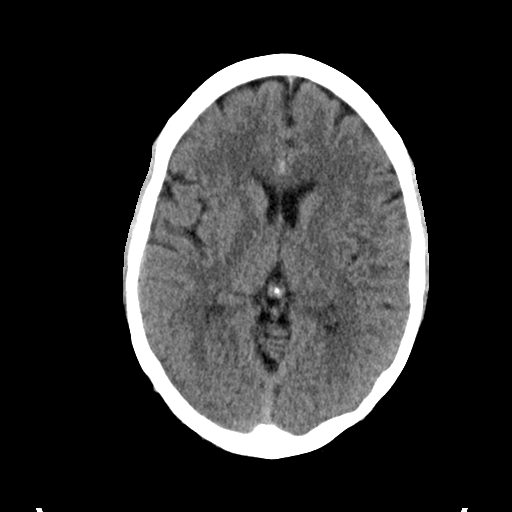
[im 19/30  brain]
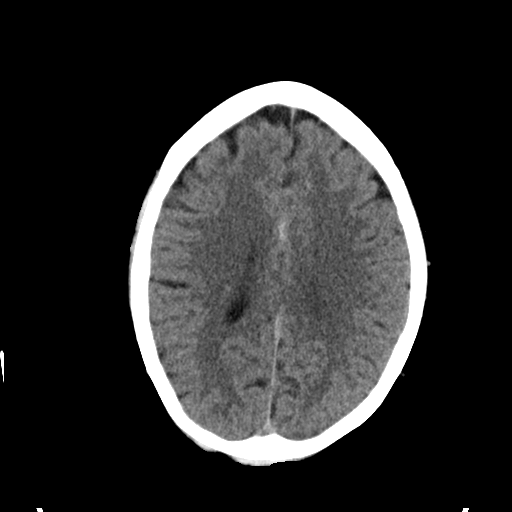
[im 19/30  bone]
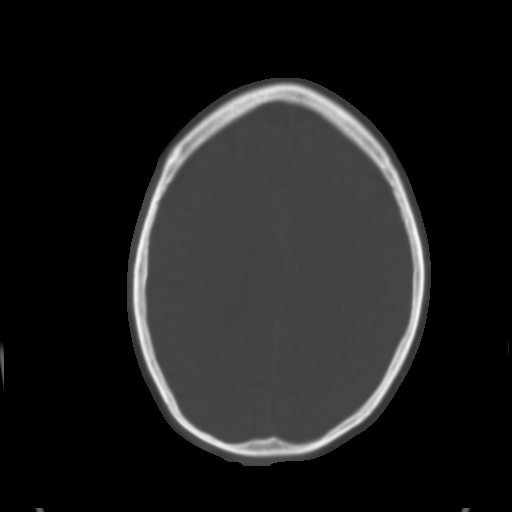
[im 22/30  brain]
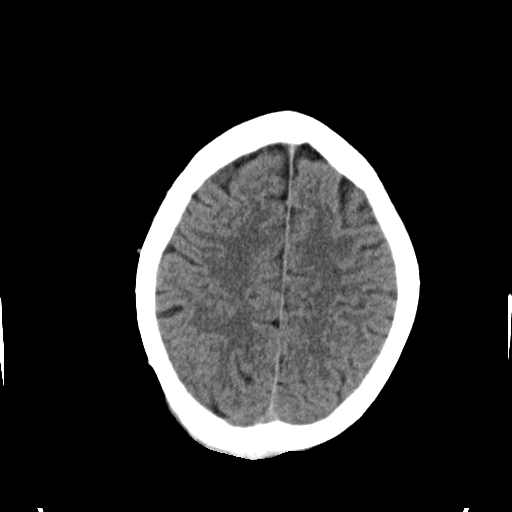
[im 26/30  brain]
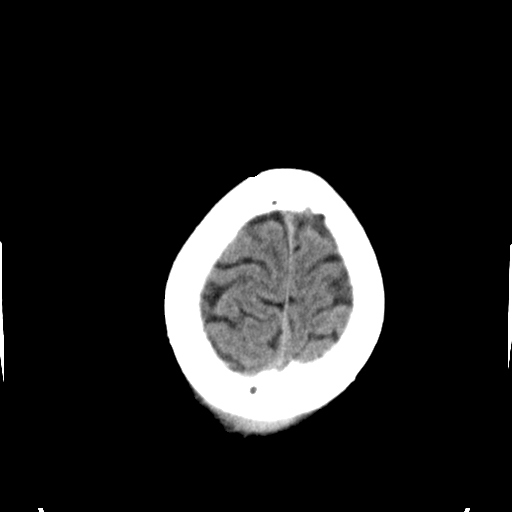

[Series 3: head bone · axial · 0.44mm/px · z∈[+1083,+1113]mm · 3 of 75 slices shown]
[im 8/75  bone]
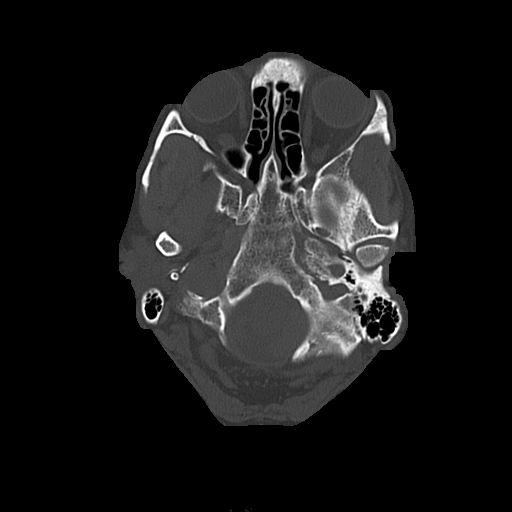
[im 15/75  bone]
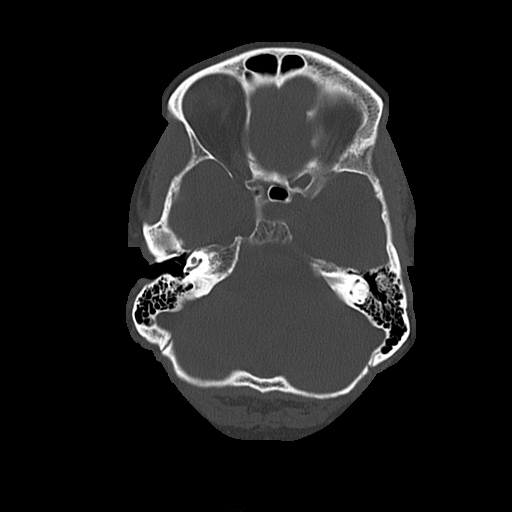
[im 23/75  bone]
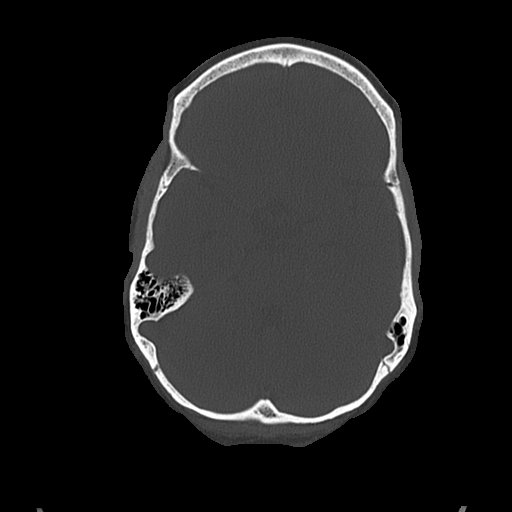

[Series 4: coronal soft · coronal · 0.31mm/px · 3 of 69 slices shown]
[im 23/69  brain]
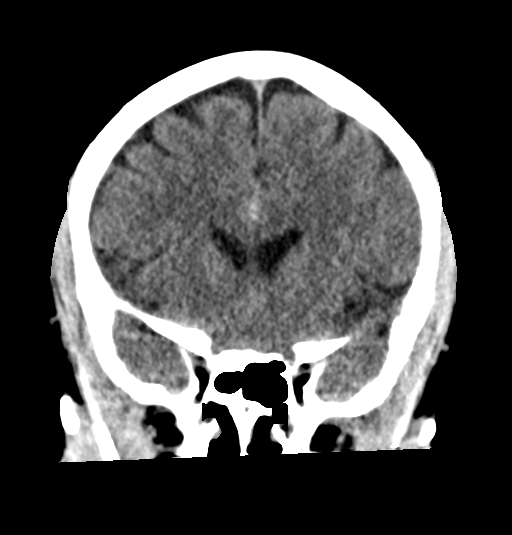
[im 31/69  brain]
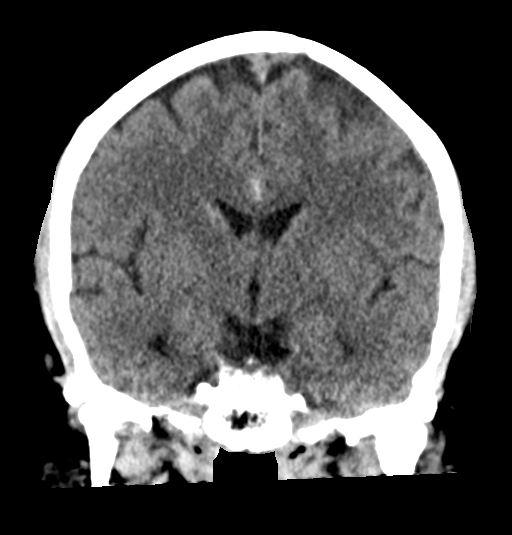
[im 38/69  brain]
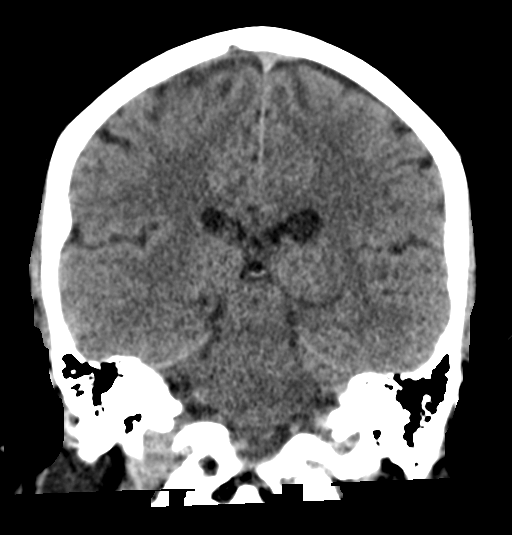

[Series 5: sagittal soft · sagittal · 0.32mm/px · 3 of 53 slices shown]
[im 18/53  brain]
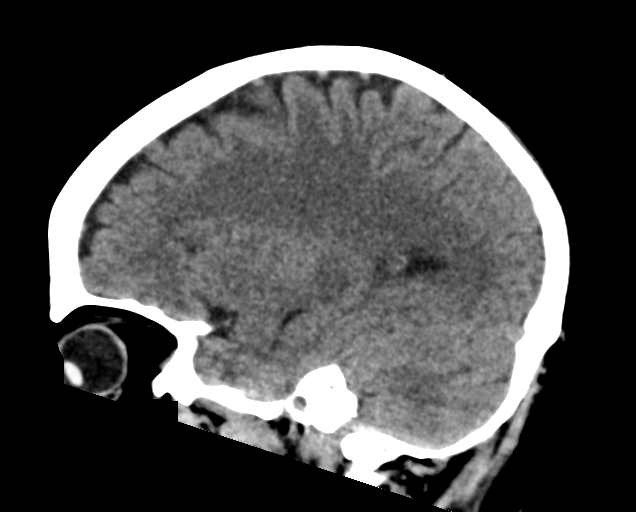
[im 27/53  brain]
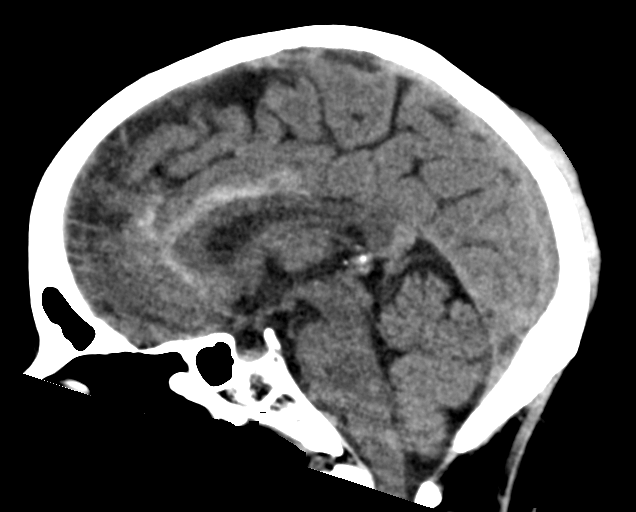
[im 35/53  brain]
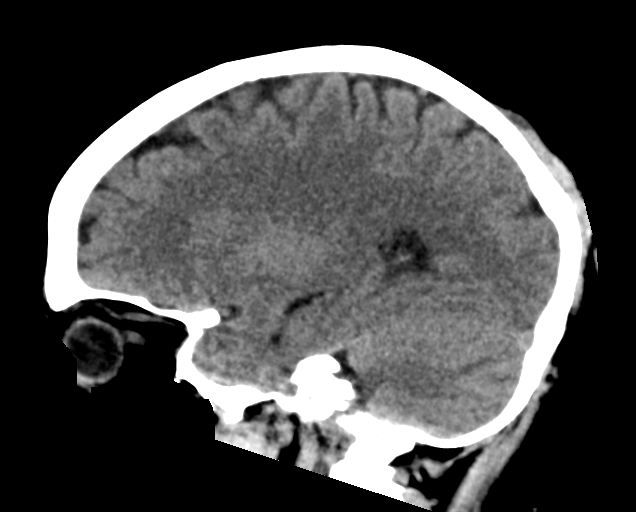

[16 of 47 positions shown; findings below may reference images not displayed]

FINDINGS: Brain: Normal ventricular morphology. No midline shift or mass
effect. Subarachnoid hemorrhage identified within the anterior
interhemispheric fissure and layering atop the corpus callosum. No
intraparenchymal hemorrhage, mass lesion, or evidence of acute
infarction. No additional extra-axial fluid collections.

Vascular: No hyperdense vessels

Skull: Calvaria intact.  Posterior RIGHT parietal scalp hematoma.

Sinuses/Orbits: Clear

Other: N/A
IMPRESSION: Acute subarachnoid hemorrhage at the anterior interhemispheric
fissure and layering atop the anterior corpus callosum.

No intraparenchymal abnormalities.

Critical Value/emergent results were called by telephone at the time
of interpretation on 09/07/2021 at [DATE] to provider BLONDINACKA SAMSONAITE
DO, who verbally acknowledged these results.

## 2023-10-11 IMAGING — CT CT CHEST W/O CM
2 of 4 series · 15 of 36 positions shown, 18 images · non-contrast
Comparison: Chest radiograph of 1 day prior. No prior CT.

CLINICAL DATA: Pneumonia.  Syncope and fall.  Weakness for 1 week.



[Series 3: chest wo · axial · 0.85mm/px · z∈[+1158,+1384]mm · 12 of 135 slices shown, 15 images]
[im 11/135  mediastinal]
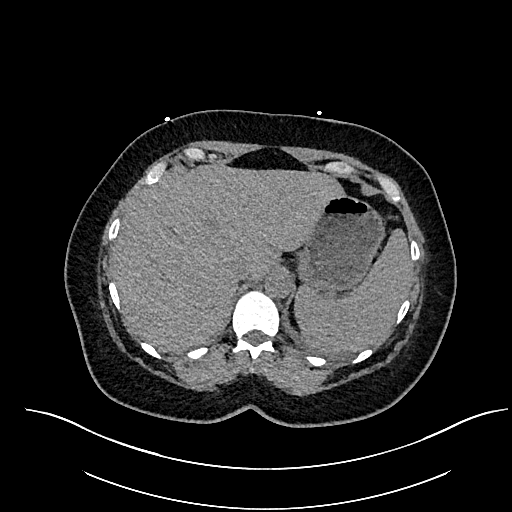
[im 11/135  lung]
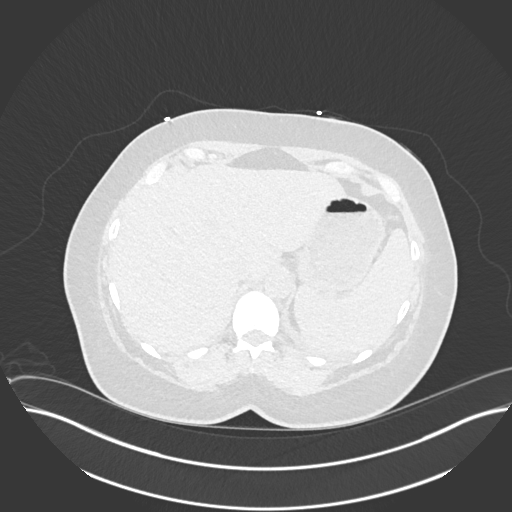
[im 21/135  lung]
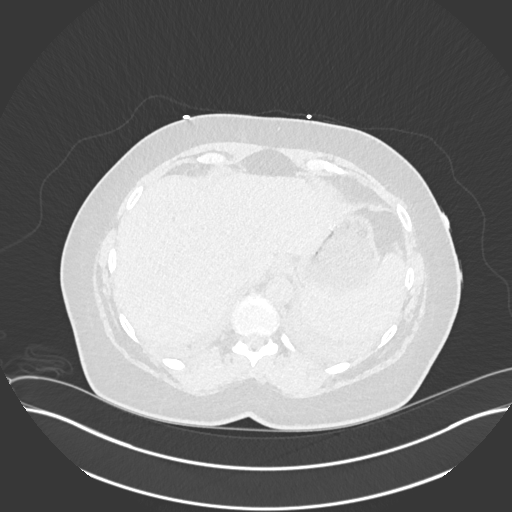
[im 31/135  lung]
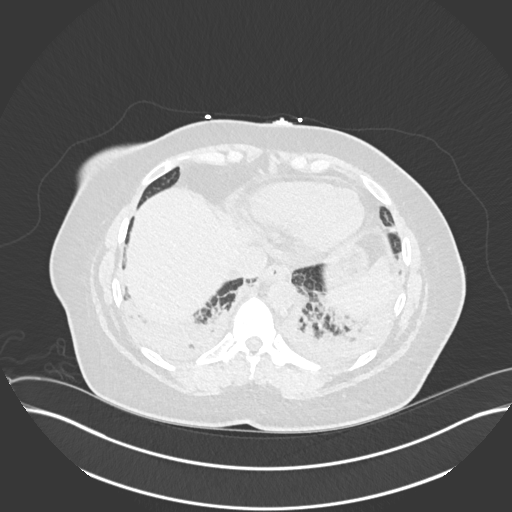
[im 42/135  lung]
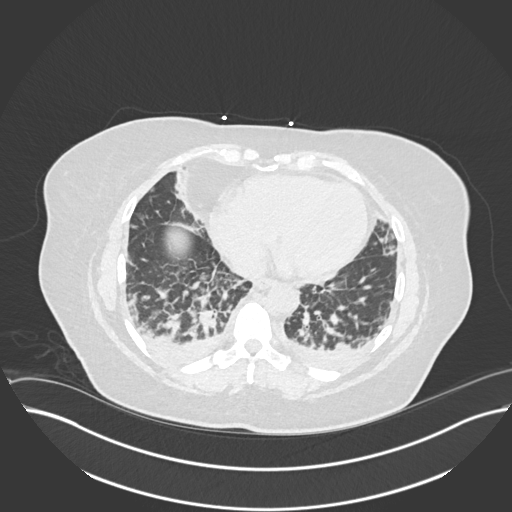
[im 52/135  mediastinal]
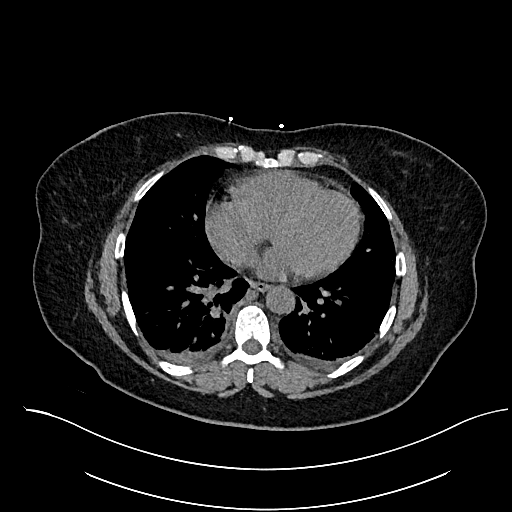
[im 52/135  lung]
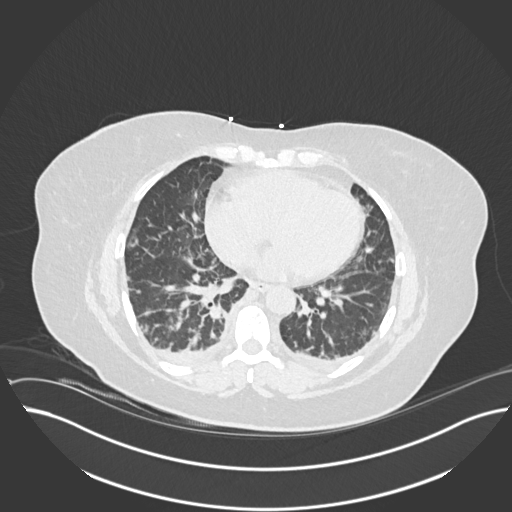
[im 62/135  lung]
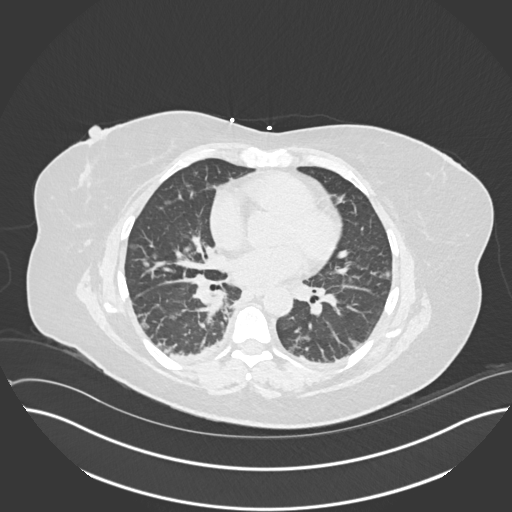
[im 73/135  lung]
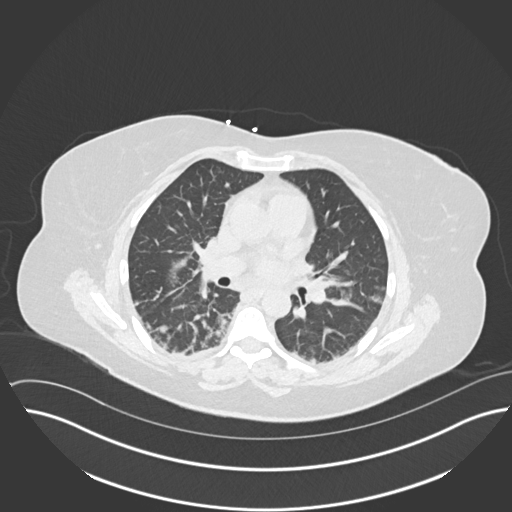
[im 83/135  lung]
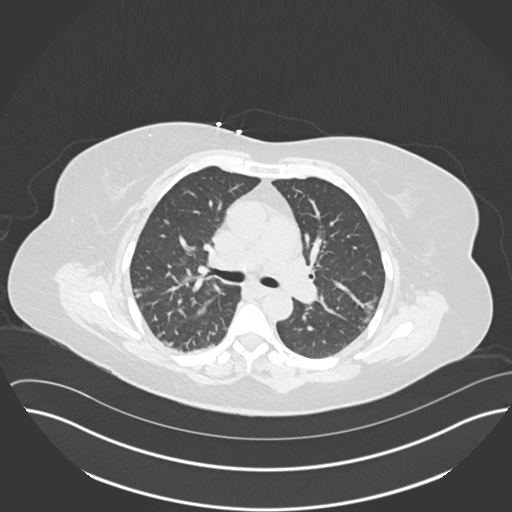
[im 93/135  mediastinal]
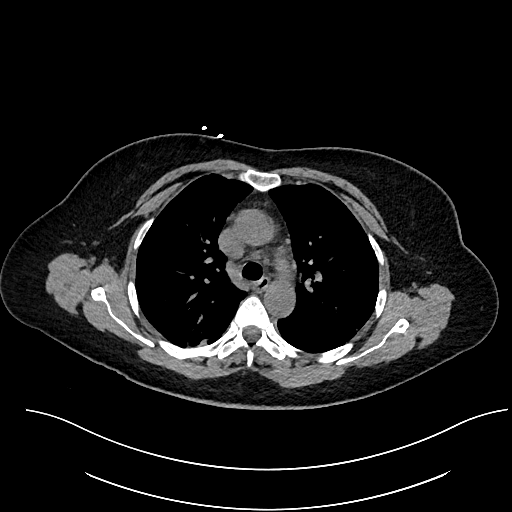
[im 93/135  lung]
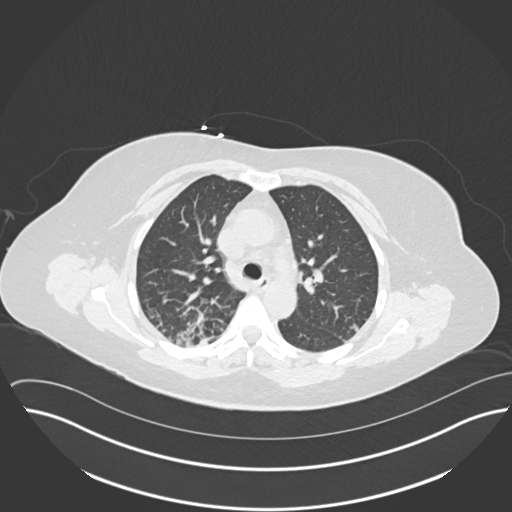
[im 104/135  lung]
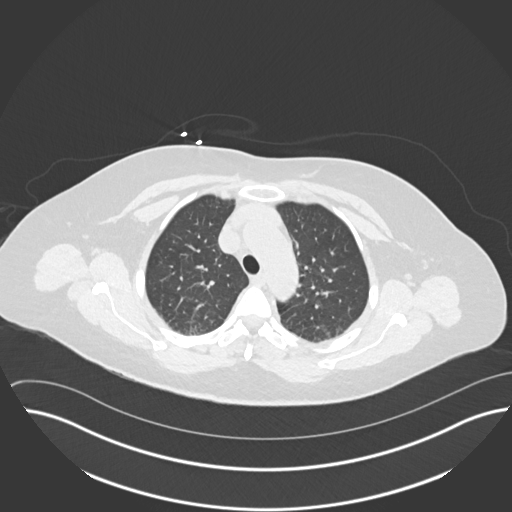
[im 114/135  lung]
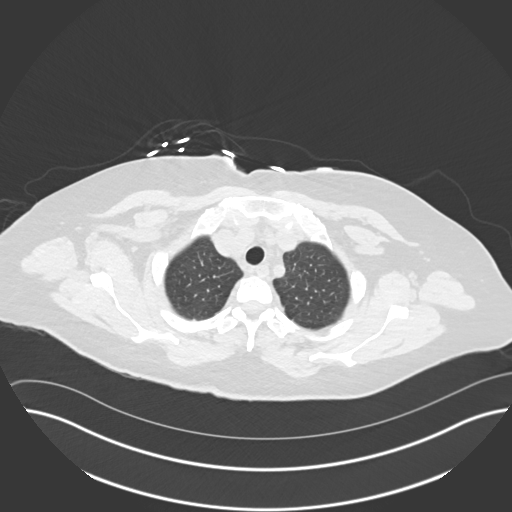
[im 124/135  lung]
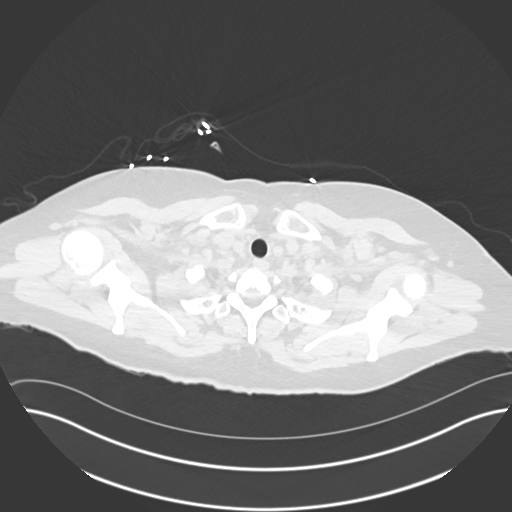

[Series 6: cor · coronal · 0.54mm/px · 3 of 139 slices shown]
[im 28/139  lung]
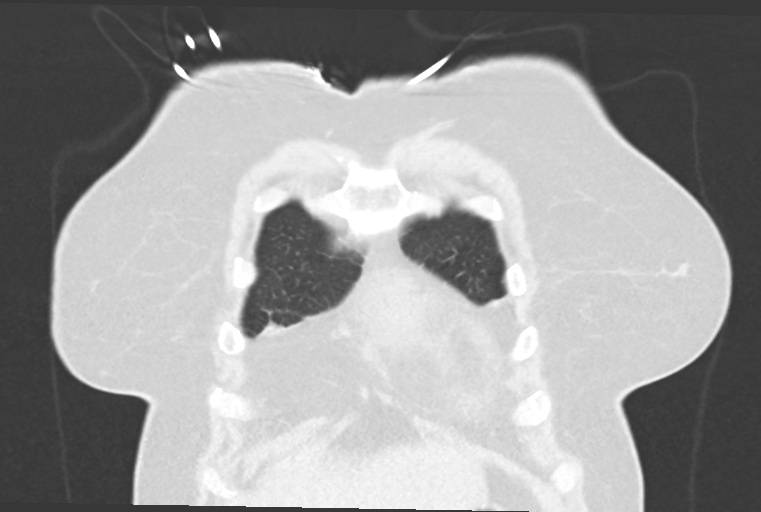
[im 56/139  lung]
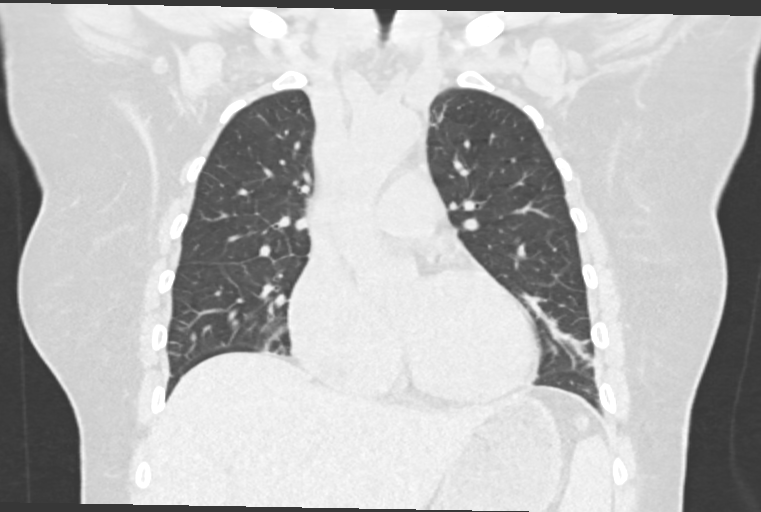
[im 83/139  lung]
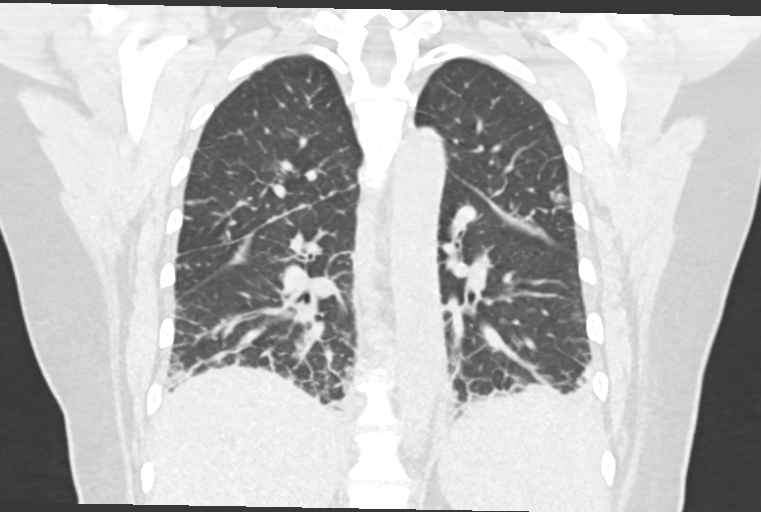

[15 of 36 positions shown; findings below may reference images not displayed]

FINDINGS: Cardiovascular: Bovine arch. Aortic atherosclerosis. Mild
cardiomegaly. Three vessel coronary artery calcification.

Mediastinum/Nodes: No mediastinal or definite hilar adenopathy,
given limitations of unenhanced CT.

Lungs/Pleura: Tiny bilateral pleural effusions. Smooth septal
thickening in the lung bases.

Dependent upper and lower lobe reticulonodular opacities.

Upper Abdomen: Normal imaged portions of the liver, spleen, stomach

Musculoskeletal: No acute osseous abnormality.
IMPRESSION: 1. Small bilateral pleural effusions with lower lung predominant
smooth septal thickening, most consistent with pulmonary edema.
Concurrent dependent bilateral upper and lower lobe reticulonodular
opacities. Felt to represent concurrent mild infection or
aspiration.
2. Age advanced coronary artery atherosclerosis. Recommend
assessment of coronary risk factors.
3.  Aortic Atherosclerosis ([id number]).
